# Patient Record
Sex: Female | Born: 1970 | Race: Black or African American | Hispanic: No | Marital: Married | Smoking: Never smoker
Health system: Southern US, Community
[De-identification: ages and names within clinical notes are randomized; demographics above are authoritative.]

## PROBLEM LIST (undated history)

## (undated) DIAGNOSIS — R200 Anesthesia of skin: Secondary | ICD-10-CM

## (undated) DIAGNOSIS — I10 Essential (primary) hypertension: Secondary | ICD-10-CM

## (undated) DIAGNOSIS — K589 Irritable bowel syndrome without diarrhea: Secondary | ICD-10-CM

## (undated) DIAGNOSIS — R52 Pain, unspecified: Secondary | ICD-10-CM

## (undated) DIAGNOSIS — K219 Gastro-esophageal reflux disease without esophagitis: Secondary | ICD-10-CM

## (undated) DIAGNOSIS — G43909 Migraine, unspecified, not intractable, without status migrainosus: Secondary | ICD-10-CM

## (undated) HISTORY — DX: Anesthesia of skin: R20.0

## (undated) HISTORY — DX: Pain, unspecified: R52

## (undated) HISTORY — DX: Irritable bowel syndrome, unspecified: K58.9

## (undated) HISTORY — PX: OTHER SURGICAL HISTORY: SHX169

## (undated) HISTORY — DX: Migraine, unspecified, not intractable, without status migrainosus: G43.909

## (undated) HISTORY — PX: ABDOMINAL HYSTERECTOMY: SUR658

## (undated) HISTORY — PX: ABDOMINAL HYSTERECTOMY: SHX81

## (undated) HISTORY — PX: BREAST BIOPSY: SHX20

---

## 1997-11-02 HISTORY — PX: TUBAL LIGATION: SHX77

## 1997-12-06 ENCOUNTER — Inpatient Hospital Stay (HOSPITAL_COMMUNITY): Admission: AD | Admit: 1997-12-06 | Discharge: 1997-12-06 | Payer: Self-pay | Admitting: Obstetrics and Gynecology

## 1997-12-13 ENCOUNTER — Inpatient Hospital Stay (HOSPITAL_COMMUNITY): Admission: RE | Admit: 1997-12-13 | Discharge: 1997-12-14 | Payer: Self-pay | Admitting: Obstetrics and Gynecology

## 1997-12-27 ENCOUNTER — Inpatient Hospital Stay (HOSPITAL_COMMUNITY): Admission: AD | Admit: 1997-12-27 | Discharge: 1997-12-27 | Payer: Self-pay | Admitting: Obstetrics & Gynecology

## 1998-01-09 ENCOUNTER — Inpatient Hospital Stay (HOSPITAL_COMMUNITY): Admission: AD | Admit: 1998-01-09 | Discharge: 1998-01-12 | Payer: Self-pay | Admitting: Obstetrics and Gynecology

## 1998-06-20 ENCOUNTER — Ambulatory Visit (HOSPITAL_COMMUNITY): Admission: RE | Admit: 1998-06-20 | Discharge: 1998-06-20 | Payer: Self-pay | Admitting: Obstetrics and Gynecology

## 1999-02-18 ENCOUNTER — Ambulatory Visit (HOSPITAL_COMMUNITY): Admission: RE | Admit: 1999-02-18 | Discharge: 1999-02-18 | Payer: Self-pay | Admitting: Family Medicine

## 1999-02-18 ENCOUNTER — Encounter: Payer: Self-pay | Admitting: Family Medicine

## 2000-03-07 ENCOUNTER — Emergency Department (HOSPITAL_COMMUNITY): Admission: EM | Admit: 2000-03-07 | Discharge: 2000-03-07 | Payer: Self-pay | Admitting: Emergency Medicine

## 2000-09-08 ENCOUNTER — Encounter: Admission: RE | Admit: 2000-09-08 | Discharge: 2000-09-08 | Payer: Self-pay | Admitting: Family Medicine

## 2000-09-08 ENCOUNTER — Encounter: Payer: Self-pay | Admitting: Family Medicine

## 2001-01-31 ENCOUNTER — Encounter: Admission: RE | Admit: 2001-01-31 | Discharge: 2001-01-31 | Payer: Self-pay | Admitting: Family Medicine

## 2001-01-31 ENCOUNTER — Encounter: Payer: Self-pay | Admitting: Family Medicine

## 2001-05-06 ENCOUNTER — Other Ambulatory Visit: Admission: RE | Admit: 2001-05-06 | Discharge: 2001-05-06 | Payer: Self-pay | Admitting: *Deleted

## 2001-11-26 ENCOUNTER — Emergency Department (HOSPITAL_COMMUNITY): Admission: EM | Admit: 2001-11-26 | Discharge: 2001-11-26 | Payer: Self-pay | Admitting: *Deleted

## 2002-04-04 ENCOUNTER — Emergency Department (HOSPITAL_COMMUNITY): Admission: EM | Admit: 2002-04-04 | Discharge: 2002-04-05 | Payer: Self-pay | Admitting: Emergency Medicine

## 2002-06-21 ENCOUNTER — Other Ambulatory Visit: Admission: RE | Admit: 2002-06-21 | Discharge: 2002-06-21 | Payer: Self-pay | Admitting: Family Medicine

## 2002-08-07 ENCOUNTER — Emergency Department (HOSPITAL_COMMUNITY): Admission: EM | Admit: 2002-08-07 | Discharge: 2002-08-07 | Payer: Self-pay

## 2002-12-06 ENCOUNTER — Other Ambulatory Visit: Admission: RE | Admit: 2002-12-06 | Discharge: 2002-12-06 | Payer: Self-pay | Admitting: Obstetrics and Gynecology

## 2002-12-18 ENCOUNTER — Ambulatory Visit (HOSPITAL_COMMUNITY): Admission: RE | Admit: 2002-12-18 | Discharge: 2002-12-18 | Payer: Self-pay | Admitting: Obstetrics and Gynecology

## 2002-12-18 ENCOUNTER — Encounter: Payer: Self-pay | Admitting: Obstetrics and Gynecology

## 2003-07-11 ENCOUNTER — Encounter: Payer: Self-pay | Admitting: Family Medicine

## 2003-07-11 ENCOUNTER — Encounter: Admission: RE | Admit: 2003-07-11 | Discharge: 2003-07-11 | Payer: Self-pay | Admitting: Family Medicine

## 2004-01-10 ENCOUNTER — Emergency Department (HOSPITAL_COMMUNITY): Admission: EM | Admit: 2004-01-10 | Discharge: 2004-01-10 | Payer: Self-pay | Admitting: Emergency Medicine

## 2004-01-22 ENCOUNTER — Other Ambulatory Visit: Admission: RE | Admit: 2004-01-22 | Discharge: 2004-01-22 | Payer: Self-pay | Admitting: Obstetrics and Gynecology

## 2004-08-12 ENCOUNTER — Emergency Department (HOSPITAL_COMMUNITY): Admission: EM | Admit: 2004-08-12 | Discharge: 2004-08-12 | Payer: Self-pay | Admitting: Family Medicine

## 2004-08-12 ENCOUNTER — Ambulatory Visit (HOSPITAL_COMMUNITY): Admission: RE | Admit: 2004-08-12 | Discharge: 2004-08-12 | Payer: Self-pay | Admitting: Family Medicine

## 2004-10-03 ENCOUNTER — Emergency Department (HOSPITAL_COMMUNITY): Admission: EM | Admit: 2004-10-03 | Discharge: 2004-10-03 | Payer: Self-pay | Admitting: Emergency Medicine

## 2004-11-02 ENCOUNTER — Emergency Department (HOSPITAL_COMMUNITY): Admission: EM | Admit: 2004-11-02 | Discharge: 2004-11-03 | Payer: Self-pay | Admitting: Emergency Medicine

## 2005-06-27 ENCOUNTER — Emergency Department (HOSPITAL_COMMUNITY): Admission: EM | Admit: 2005-06-27 | Discharge: 2005-06-28 | Payer: Self-pay | Admitting: Family Medicine

## 2005-09-08 ENCOUNTER — Emergency Department (HOSPITAL_COMMUNITY): Admission: EM | Admit: 2005-09-08 | Discharge: 2005-09-08 | Payer: Self-pay | Admitting: Emergency Medicine

## 2005-10-09 ENCOUNTER — Emergency Department (HOSPITAL_COMMUNITY): Admission: EM | Admit: 2005-10-09 | Discharge: 2005-10-09 | Payer: Self-pay | Admitting: Family Medicine

## 2005-11-02 HISTORY — PX: KNEE SURGERY: SHX244

## 2006-04-02 ENCOUNTER — Ambulatory Visit (HOSPITAL_COMMUNITY): Admission: RE | Admit: 2006-04-02 | Discharge: 2006-04-02 | Payer: Self-pay | Admitting: Obstetrics & Gynecology

## 2006-07-09 ENCOUNTER — Emergency Department (HOSPITAL_COMMUNITY): Admission: EM | Admit: 2006-07-09 | Discharge: 2006-07-10 | Payer: Self-pay | Admitting: Emergency Medicine

## 2007-11-24 ENCOUNTER — Emergency Department (HOSPITAL_COMMUNITY): Admission: EM | Admit: 2007-11-24 | Discharge: 2007-11-24 | Payer: Self-pay | Admitting: Emergency Medicine

## 2009-02-26 ENCOUNTER — Encounter: Admission: RE | Admit: 2009-02-26 | Discharge: 2009-02-26 | Payer: Self-pay | Admitting: Family Medicine

## 2009-11-04 ENCOUNTER — Emergency Department (HOSPITAL_COMMUNITY): Admission: EM | Admit: 2009-11-04 | Discharge: 2009-11-05 | Payer: Self-pay | Admitting: Emergency Medicine

## 2010-01-30 ENCOUNTER — Encounter: Admission: RE | Admit: 2010-01-30 | Discharge: 2010-01-30 | Payer: Self-pay | Admitting: Obstetrics & Gynecology

## 2010-08-20 ENCOUNTER — Encounter: Admission: RE | Admit: 2010-08-20 | Discharge: 2010-08-20 | Payer: Self-pay | Admitting: Obstetrics & Gynecology

## 2010-11-23 ENCOUNTER — Encounter: Payer: Self-pay | Admitting: Family Medicine

## 2011-01-17 LAB — CBC
Hemoglobin: 9.7 g/dL — ABNORMAL LOW (ref 12.0–15.0)
MCHC: 32.4 g/dL (ref 30.0–36.0)
MCV: 78 fL (ref 78.0–100.0)
RBC: 3.84 MIL/uL — ABNORMAL LOW (ref 3.87–5.11)

## 2011-01-17 LAB — DIFFERENTIAL
Basophils Relative: 2 % — ABNORMAL HIGH (ref 0–1)
Eosinophils Absolute: 0.1 10*3/uL (ref 0.0–0.7)
Monocytes Absolute: 0.2 10*3/uL (ref 0.1–1.0)
Monocytes Relative: 4 % (ref 3–12)

## 2011-01-17 LAB — URINALYSIS, ROUTINE W REFLEX MICROSCOPIC
Nitrite: NEGATIVE
Specific Gravity, Urine: 1.018 (ref 1.005–1.030)
Urobilinogen, UA: 1 mg/dL (ref 0.0–1.0)

## 2011-01-17 LAB — GC/CHLAMYDIA PROBE AMP, GENITAL
Chlamydia, DNA Probe: NEGATIVE
GC Probe Amp, Genital: NEGATIVE

## 2011-01-17 LAB — POCT I-STAT, CHEM 8
Calcium, Ion: 1.11 mmol/L — ABNORMAL LOW (ref 1.12–1.32)
Chloride: 107 mEq/L (ref 96–112)
Creatinine, Ser: 0.8 mg/dL (ref 0.4–1.2)
Glucose, Bld: 90 mg/dL (ref 70–99)
HCT: 32 % — ABNORMAL LOW (ref 36.0–46.0)

## 2011-07-31 ENCOUNTER — Other Ambulatory Visit (HOSPITAL_COMMUNITY): Payer: Self-pay | Admitting: Gastroenterology

## 2011-07-31 DIAGNOSIS — K219 Gastro-esophageal reflux disease without esophagitis: Secondary | ICD-10-CM

## 2011-08-20 ENCOUNTER — Encounter (HOSPITAL_COMMUNITY)
Admission: RE | Admit: 2011-08-20 | Discharge: 2011-08-20 | Disposition: A | Discharge status: Home / Self Care | Payer: 59 | Source: Ambulatory Visit | Attending: Gastroenterology | Admitting: Gastroenterology

## 2011-08-20 ENCOUNTER — Encounter (HOSPITAL_COMMUNITY): Payer: Self-pay

## 2011-08-20 DIAGNOSIS — K219 Gastro-esophageal reflux disease without esophagitis: Secondary | ICD-10-CM

## 2011-08-20 DIAGNOSIS — R109 Unspecified abdominal pain: Secondary | ICD-10-CM | POA: Insufficient documentation

## 2011-08-20 HISTORY — DX: Gastro-esophageal reflux disease without esophagitis: K21.9

## 2011-08-20 MED ORDER — TECHNETIUM TC 99M SULFUR COLLOID: 2.0000 | Freq: Once | INTRAVENOUS | Status: AC | PRN

## 2011-10-15 ENCOUNTER — Encounter (HOSPITAL_COMMUNITY): Payer: Self-pay | Admitting: *Deleted

## 2011-10-15 ENCOUNTER — Emergency Department (HOSPITAL_COMMUNITY)
Admission: EM | Admit: 2011-10-15 | Discharge: 2011-10-15 | Disposition: A | Discharge status: Home / Self Care | Payer: 59 | Source: Home / Self Care | Attending: Family Medicine | Admitting: Family Medicine

## 2011-10-15 DIAGNOSIS — J069 Acute upper respiratory infection, unspecified: Secondary | ICD-10-CM

## 2011-10-15 HISTORY — DX: Essential (primary) hypertension: I10

## 2011-10-15 MED ORDER — IPRATROPIUM BROMIDE 0.06 % NA SOLN: 2.0000 | Freq: Four times a day (QID) | NASAL | Status: DC

## 2011-10-15 MED ORDER — HYDROCOD POLST-CHLORPHEN POLST 10-8 MG/5ML PO LQCR: 5.0000 mL | Freq: Two times a day (BID) | ORAL | Status: DC

## 2011-10-15 NOTE — ED Provider Notes (Signed)
History     CSN: [id number] Arrival date & time: 10/15/2011  6:06 PM   First MD Initiated Contact with Patient 10/15/11 1728      Chief Complaint  Patient presents with  . Cough    (Consider location/radiation/quality/duration/timing/severity/associated sxs/prior treatment) Patient is a 40 y.o. female presenting with cough. The history is provided by the patient.  Cough This is a new problem. The current episode started more than 1 week ago. The problem has not changed since onset.The cough is productive of sputum. Associated symptoms include ear congestion, ear pain, rhinorrhea and sore throat. She has tried cough syrup for the symptoms. The treatment provided no relief. She is not a smoker.    Past Medical History  Diagnosis Date  . Esophageal reflux   . Hypertension     Past Surgical History  Procedure Date  . Btl   . Knee surgery     Family History  Problem Relation Age of Onset  . Hypertension Mother   . Hypertension Father     History  Substance Use Topics  . Smoking status: Never Smoker   . Smokeless tobacco: Not on file  . Alcohol Use: Yes     SOCIAL    OB History    Grav Para Term Preterm Abortions TAB SAB Ect Mult Living                  Review of Systems  Constitutional: Positive for fever.  HENT: Positive for ear pain, congestion, sore throat, rhinorrhea and postnasal drip.   Respiratory: Positive for cough.   Cardiovascular: Negative.   Gastrointestinal: Negative.     Allergies  Review of patient's allergies indicates no known allergies.  Home Medications   Current Outpatient Rx  Name Route Sig Dispense Refill  . HYDROCHLOROTHIAZIDE 25 MG PO TABS Oral Take 25 mg by mouth daily.      Marland Kitchen OMEPRAZOLE 20 MG PO CPDR Oral Take 20 mg by mouth daily.      Marland Kitchen HYDROCOD POLST-CHLORPHEN POLST 10-8 MG/5ML PO LQCR Oral Take 5 mLs by mouth every 12 (twelve) hours. For cough 115 mL 0  . IPRATROPIUM BROMIDE 0.06 % NA SOLN Nasal Place 2 sprays into the  nose 4 (four) times daily. 15 mL 0    BP 158/101  Pulse 68  Temp(Src) 98.3 F (36.8 C) (Oral)  Resp 16  SpO2 100%  LMP 10/05/2011  Physical Exam  Nursing note and vitals reviewed. Constitutional: She appears well-developed and well-nourished.  HENT:  Head: Normocephalic.  Right Ear: External ear normal.  Left Ear: External ear normal.  Mouth/Throat: Oropharynx is clear and moist.  Eyes: Conjunctivae and EOM are normal. Pupils are equal, round, and reactive to light.  Neck: Normal range of motion. Neck supple.  Cardiovascular: Normal rate, normal heart sounds and intact distal pulses.   Pulmonary/Chest: Effort normal and breath sounds normal.  Abdominal: Soft. Bowel sounds are normal.  Lymphadenopathy:    She has no cervical adenopathy.  Skin: Skin is warm and dry.    ED Course  Procedures (including critical care time)  Labs Reviewed - No data to display No results found.   1. URI (upper respiratory infection)       MDM          Barkley Bruns, MD 10/15/11 1843

## 2011-10-15 NOTE — ED Notes (Signed)
pT  HAS  SYMPTOMS  OF  COUGH / CONGESTED  EARS  POPPING  WEAKNESS  AND  ACHING ALL  OVER   X  1  WEEK    PT  AWAKE  ALERT  TALKING IN  COMPLETE  SENTANCES

## 2012-05-04 ENCOUNTER — Other Ambulatory Visit: Payer: Self-pay | Admitting: Gastroenterology

## 2012-05-04 DIAGNOSIS — R109 Unspecified abdominal pain: Secondary | ICD-10-CM

## 2012-05-09 ENCOUNTER — Ambulatory Visit
Admission: RE | Admit: 2012-05-09 | Discharge: 2012-05-09 | Disposition: A | Discharge status: Home / Self Care | Payer: 59 | Source: Ambulatory Visit | Attending: Gastroenterology | Admitting: Gastroenterology

## 2012-05-09 DIAGNOSIS — R109 Unspecified abdominal pain: Secondary | ICD-10-CM

## 2012-05-26 ENCOUNTER — Other Ambulatory Visit: Payer: Self-pay | Admitting: Obstetrics & Gynecology

## 2012-05-26 DIAGNOSIS — N6310 Unspecified lump in the right breast, unspecified quadrant: Secondary | ICD-10-CM

## 2012-05-27 ENCOUNTER — Ambulatory Visit
Admission: RE | Admit: 2012-05-27 | Discharge: 2012-05-27 | Disposition: A | Discharge status: Home / Self Care | Payer: 59 | Source: Ambulatory Visit | Attending: Obstetrics & Gynecology | Admitting: Obstetrics & Gynecology

## 2012-05-27 ENCOUNTER — Other Ambulatory Visit: Payer: Self-pay | Admitting: Obstetrics & Gynecology

## 2012-05-27 DIAGNOSIS — N6310 Unspecified lump in the right breast, unspecified quadrant: Secondary | ICD-10-CM

## 2012-05-27 DIAGNOSIS — N6320 Unspecified lump in the left breast, unspecified quadrant: Secondary | ICD-10-CM

## 2012-06-07 ENCOUNTER — Other Ambulatory Visit: Payer: Self-pay | Admitting: Obstetrics & Gynecology

## 2012-06-07 ENCOUNTER — Ambulatory Visit
Admission: RE | Admit: 2012-06-07 | Discharge: 2012-06-07 | Disposition: A | Discharge status: Home / Self Care | Payer: 59 | Source: Ambulatory Visit | Attending: Obstetrics & Gynecology | Admitting: Obstetrics & Gynecology

## 2012-06-07 DIAGNOSIS — N6320 Unspecified lump in the left breast, unspecified quadrant: Secondary | ICD-10-CM

## 2012-06-07 HISTORY — PX: BREAST BIOPSY: SHX20

## 2012-06-08 ENCOUNTER — Inpatient Hospital Stay: Admission: RE | Admit: 2012-06-08 | Payer: 59 | Source: Ambulatory Visit

## 2012-06-21 ENCOUNTER — Other Ambulatory Visit (HOSPITAL_COMMUNITY): Payer: Self-pay | Admitting: Otolaryngology

## 2012-06-21 DIAGNOSIS — R0989 Other specified symptoms and signs involving the circulatory and respiratory systems: Secondary | ICD-10-CM

## 2012-06-29 ENCOUNTER — Other Ambulatory Visit (HOSPITAL_COMMUNITY): Payer: 59

## 2012-06-29 ENCOUNTER — Ambulatory Visit (HOSPITAL_COMMUNITY): Admission: RE | Admit: 2012-06-29 | Payer: 59 | Source: Ambulatory Visit

## 2012-07-01 ENCOUNTER — Other Ambulatory Visit (HOSPITAL_COMMUNITY): Payer: 59

## 2012-07-06 ENCOUNTER — Ambulatory Visit (HOSPITAL_COMMUNITY)
Admission: RE | Admit: 2012-07-06 | Discharge: 2012-07-06 | Disposition: A | Discharge status: Home / Self Care | Payer: 59 | Source: Ambulatory Visit | Attending: Family Medicine | Admitting: Family Medicine

## 2012-07-06 ENCOUNTER — Ambulatory Visit (HOSPITAL_COMMUNITY)
Admission: RE | Admit: 2012-07-06 | Discharge: 2012-07-06 | Disposition: A | Discharge status: Home / Self Care | Payer: 59 | Source: Ambulatory Visit | Attending: Otolaryngology | Admitting: Otolaryngology

## 2012-07-06 DIAGNOSIS — R0989 Other specified symptoms and signs involving the circulatory and respiratory systems: Secondary | ICD-10-CM

## 2012-07-06 DIAGNOSIS — K219 Gastro-esophageal reflux disease without esophagitis: Secondary | ICD-10-CM | POA: Insufficient documentation

## 2012-07-06 DIAGNOSIS — R1319 Other dysphagia: Secondary | ICD-10-CM | POA: Insufficient documentation

## 2012-07-06 DIAGNOSIS — R1310 Dysphagia, unspecified: Secondary | ICD-10-CM | POA: Insufficient documentation

## 2012-07-06 DIAGNOSIS — I10 Essential (primary) hypertension: Secondary | ICD-10-CM | POA: Insufficient documentation

## 2012-07-06 DIAGNOSIS — R6889 Other general symptoms and signs: Secondary | ICD-10-CM | POA: Insufficient documentation

## 2012-07-06 NOTE — Procedures (Signed)
Objective Swallowing Evaluation: Modified Barium Swallowing Study  Patient Details  Name: Doral Digangi MRN: [id number] Date of Birth: [birth year]  Today's Date: 07/06/2012 Time: 1145-1200 SLP Time Calculation (min): 15 min  Past Medical History:  Past Medical History  Diagnosis Date  . Esophageal reflux   . Hypertension    Past Surgical History:  Past Surgical History  Procedure Date  . Btl   . Knee surgery    HPI:  41 year old female seen for outpatient MBS secondary to c/o globus which has been occurring for 2 years. She reports a h/o HTN as well as GERD which was diagnosed 2 years ago. Currently takes omeprazole.      Assessment / Plan / Recommendation Clinical Impression  Dysphagia Diagnosis: Suspected primary esophageal dysphagia Clinical impression: Suspect a primary esophageal dysphagia based on normal oropharyngeal swallowing function with continued c/o globus despite full pharyngeal clearance. Pill was noted to delay briefly (1-2) seconds in lower cervical esophagus before clearing. Note that patient does have a barium swallow scheduled for after today's exam which would be recommended to further assess esophageal function. No further acute SLP needs indicated at this time. Provided brief education regarding general esophageal precautions including diet texture recommendations and compensatory strategies which may eleviate symptoms.     Treatment Recommendation  No treatment recommended at this time    Diet Recommendation Regular;Thin liquid (avoid very dry, crumbly, or tough solids)   Liquid Administration via: Cup;Straw Medication Administration: Whole meds with liquid Compensations: Slow rate;Small sips/bites;Follow solids with liquid (room temperature or warm liquids may be helpful) Postural Changes and/or Swallow Maneuvers: Seated upright 90 degrees;Upright 30-60 min after meal    Other  Recommendations Recommended Consults: Consider GI evaluation Oral Care  Recommendations: Oral care BID   Follow Up Recommendations  None            General HPI: 41 year old female seen for outpatient MBS secondary to c/o globus which has been occurring for 2 years. She reports a h/o HTN as well as GERD which was diagnosed 2 years ago. Currently takes omeprazole.  Type of Study: Modified Barium Swallowing Study Reason for Referral: Objectively evaluate swallowing function Diet Prior to this Study: Regular;Thin liquids Temperature Spikes Noted: No Respiratory Status: Room air History of Recent Intubation: No Behavior/Cognition: Alert;Cooperative;Pleasant mood Oral Cavity - Dentition: Adequate natural dentition Oral Motor / Sensory Function: Within functional limits Self-Feeding Abilities: Able to feed self Patient Positioning: Upright in chair Baseline Vocal Quality: Clear Volitional Cough: Strong Volitional Swallow: Able to elicit Anatomy: Within functional limits Pharyngeal Secretions: Not observed secondary MBS    Reason for Referral Objectively evaluate swallowing function      Pharyngeal Phase Pharyngeal Phase: Within functional limits   Cervical Esophageal Phase    GO   Ferdinand Lango MA, CCC-SLP [phone]  Cervical Esophageal Phase: Impaired    Bettye Sitton Meryl 07/06/2012, 12:54 PM

## 2012-07-08 ENCOUNTER — Encounter: Payer: Self-pay | Admitting: Gastroenterology

## 2012-08-03 ENCOUNTER — Ambulatory Visit: Payer: 59 | Admitting: Gastroenterology

## 2012-11-02 HISTORY — PX: TUBAL LIGATION: SHX77

## 2012-12-17 ENCOUNTER — Emergency Department (HOSPITAL_COMMUNITY): Payer: 59

## 2012-12-17 ENCOUNTER — Emergency Department (HOSPITAL_COMMUNITY)
Admission: EM | Admit: 2012-12-17 | Discharge: 2012-12-17 | Disposition: A | Discharge status: Home / Self Care | Payer: 59 | Attending: Emergency Medicine | Admitting: Emergency Medicine

## 2012-12-17 ENCOUNTER — Encounter (HOSPITAL_COMMUNITY): Payer: Self-pay

## 2012-12-17 DIAGNOSIS — R0602 Shortness of breath: Secondary | ICD-10-CM | POA: Insufficient documentation

## 2012-12-17 DIAGNOSIS — K219 Gastro-esophageal reflux disease without esophagitis: Secondary | ICD-10-CM | POA: Insufficient documentation

## 2012-12-17 DIAGNOSIS — R51 Headache: Secondary | ICD-10-CM | POA: Insufficient documentation

## 2012-12-17 DIAGNOSIS — Z79899 Other long term (current) drug therapy: Secondary | ICD-10-CM | POA: Insufficient documentation

## 2012-12-17 DIAGNOSIS — Z9889 Other specified postprocedural states: Secondary | ICD-10-CM | POA: Insufficient documentation

## 2012-12-17 DIAGNOSIS — R42 Dizziness and giddiness: Secondary | ICD-10-CM | POA: Insufficient documentation

## 2012-12-17 LAB — COMPREHENSIVE METABOLIC PANEL
ALT: 12 U/L (ref 0–35)
AST: 26 U/L (ref 0–37)
Albumin: 3.5 g/dL (ref 3.5–5.2)
Chloride: 102 mEq/L (ref 96–112)
Creatinine, Ser: 0.82 mg/dL (ref 0.50–1.10)
Potassium: 3.6 mEq/L (ref 3.5–5.1)
Sodium: 140 mEq/L (ref 135–145)
Total Bilirubin: 0.1 mg/dL — ABNORMAL LOW (ref 0.3–1.2)

## 2012-12-17 LAB — CBC
MCV: 82.8 fL (ref 78.0–100.0)
Platelets: 327 10*3/uL (ref 150–400)
RBC: 3.95 MIL/uL (ref 3.87–5.11)
RDW: 14.9 % (ref 11.5–15.5)
WBC: 5.7 10*3/uL (ref 4.0–10.5)

## 2012-12-17 MED ORDER — MECLIZINE HCL 50 MG PO TABS: 50.0000 mg | ORAL_TABLET | Freq: Two times a day (BID) | ORAL | Status: DC

## 2012-12-17 NOTE — ED Provider Notes (Signed)
This is a 42 year old female, who presents emergency department with chief complaint of hypertension and dizziness. Patient was originally seen by Drue Novel, Cordelia Poche. Patient recently had a tubal reversal about a week ago. She has been complaining of dizziness x3 days. Patient states that she still feels a little dizzy with ambulation. She believes that the dizziness could be cleared to pain medicines that she has been taking.  Review of systems: Still slightly dizzy  PE: Gen: A&O x4, Steady gait, no ataxia HEENT: PERRL, EOM intact CHEST: RRR, no m/r/g LUNGS: CTAB, no w/r/r ABD: BS x 4, ND/NT EXT: No edema, strong peripheral pulses NEURO: Sensation and strength intact bilaterally  Assessment: Dizziness, likely secondary to home medications versus hypertension. Labs did not show any signs of end organ damage.  Results for orders placed during the hospital encounter of 12/17/12  CBC      Result Value Range   WBC 5.7  4.0 - 10.5 K/uL   RBC 3.95  3.87 - 5.11 MIL/uL   Hemoglobin 11.1 (*) 12.0 - 15.0 g/dL   HCT 96.0 (*) 45.4 - 09.8 %   MCV 82.8  78.0 - 100.0 fL   MCH 28.1  26.0 - 34.0 pg   MCHC 33.9  30.0 - 36.0 g/dL   RDW 11.9  14.7 - 82.9 %   Platelets 327  150 - 400 K/uL  COMPREHENSIVE METABOLIC PANEL      Result Value Range   Sodium 140  135 - 145 mEq/L   Potassium 3.6  3.5 - 5.1 mEq/L   Chloride 102  96 - 112 mEq/L   CO2 27  19 - 32 mEq/L   Glucose, Bld 101 (*) 70 - 99 mg/dL   BUN 11  6 - 23 mg/dL   Creatinine, Ser 5.62  0.50 - 1.10 mg/dL   Calcium 9.7  8.4 - 13.0 mg/dL   Total Protein 7.2  6.0 - 8.3 g/dL   Albumin 3.5  3.5 - 5.2 g/dL   AST 26  0 - 37 U/L   ALT 12  0 - 35 U/L   Alkaline Phosphatase 61  39 - 117 U/L   Total Bilirubin 0.1 (*) 0.3 - 1.2 mg/dL   GFR calc non Af Amer 89 (*) >90 mL/min   GFR calc Af Amer >90  >90 mL/min   Ct Head Wo Contrast  12/17/2012  *RADIOLOGY REPORT*  Clinical Data: Hypertension; lightheadedness.  CT HEAD WITHOUT CONTRAST  Technique:  Contiguous  axial images were obtained from the base of the skull through the vertex without contrast.  Comparison: CT of the head performed 06/27/2005  Findings: There is no evidence of acute infarction, mass lesion, or intra- or extra-axial hemorrhage on CT.  The posterior fossa, including the cerebellum, brainstem and fourth ventricle, is within normal limits.  The third and lateral ventricles, and basal ganglia are unremarkable in appearance.  The cerebral hemispheres are symmetric in appearance, with normal gray- white differentiation.  No mass effect or midline shift is seen.  There is no evidence of fracture; visualized osseous structures are unremarkable in appearance.  The visualized portions of the orbits are within normal limits.  The paranasal sinuses and mastoid air cells are well-aerated.  No significant soft tissue abnormalities are seen.  IMPRESSION: Unremarkable noncontrast CT of the head.   Original Report Authenticated By: Tonia Ghent, M.D.       Plan: Patient's labs and CT are reassuring. Will discharge the patient to home with primary care followup. Will  give the patient meclizine. Patient is stable and ready for discharge.  Filed Vitals:   12/17/12 0507  BP: 124/82  Pulse: 64  Temp:   Resp:       Roxy Horseman, PA-C 12/17/12 [phone]

## 2012-12-17 NOTE — ED Notes (Signed)
Pt to ct via stretcher

## 2012-12-17 NOTE — ED Provider Notes (Signed)
History     CSN: [id number]  Arrival date & time 12/17/12  0402   First MD Initiated Contact with Patient 12/17/12 0431      Chief Complaint  Patient presents with  . Hypertension    (Consider location/radiation/quality/duration/timing/severity/associated sxs/prior treatment) HPI Comments: Stellar Gensel is a 42 y.o. female w hx of HTN presents to the ER c/o worsening HTN over the last 3 days s/p tubal reversal surgery. She reports her BP getting as high as 200/110. Symptoms are moderate. No known exacerbating or alleviating factors. Associated s/s include HA, blurred vision, dizziness & SOB. She reports that "my head just feels foggy and different, i cant really explain it." Pt denies any CP, Syncope, palpitations, cough, hemoptysis, fevers, NS, chills, leg swelling, hemoptysis, change in urination or BM. No other complaints at this time.   Patient is a 42 y.o. female presenting with hypertension. The history is provided by the patient.  Hypertension    Past Medical History  Diagnosis Date  . Esophageal reflux   . Hypertension     Past Surgical History  Procedure Laterality Date  . Btl    . Knee surgery      Family History  Problem Relation Age of Onset  . Hypertension Mother   . Hypertension Father     History  Substance Use Topics  . Smoking status: Never Smoker   . Smokeless tobacco: Not on file  . Alcohol Use: Yes     Comment: SOCIAL    OB History   Grav Para Term Preterm Abortions TAB SAB Ect Mult Living                  Review of Systems  All other systems reviewed and are negative.    Allergies  Review of patient's allergies indicates no known allergies.  Home Medications   Current Outpatient Rx  Name  Route  Sig  Dispense  Refill  . ALPRAZolam (XANAX) 0.25 MG tablet   Oral   Take 0.25 mg by mouth 3 (three) times daily as needed for anxiety.         . hydrochlorothiazide (HYDRODIURIL) 25 MG tablet   Oral   Take 25 mg by mouth daily.            Marland Kitchen HYDROmorphone (DILAUDID) 2 MG tablet   Oral   Take 2 mg by mouth every 4 (four) hours as needed for pain.         Marland Kitchen ibuprofen (ADVIL,MOTRIN) 800 MG tablet   Oral   Take 800 mg by mouth every 8 (eight) hours as needed for pain.         Marland Kitchen omeprazole-sodium bicarbonate (ZEGERID) 40-1100 MG per capsule   Oral   Take 1 capsule by mouth daily before breakfast.         . promethazine (PHENERGAN) 25 MG tablet   Oral   Take 25 mg by mouth every 6 (six) hours as needed for nausea.         Marland Kitchen azithromycin (ZITHROMAX) 250 MG tablet   Oral   Take 250 mg by mouth daily.           BP 124/82  Pulse 64  Temp(Src) 98.3 F (36.8 C) (Oral)  Resp 20  SpO2 99%  LMP 12/10/2012  Physical Exam  Nursing note and vitals reviewed. Constitutional: She is oriented to person, place, and time. She appears well-developed and well-nourished. No distress.  Patient not in visible distress  HENT:  Head:  Normocephalic and atraumatic.  Eyes: Conjunctivae and EOM are normal. Pupils are equal, round, and reactive to light.  Neck: Normal range of motion. Neck supple. Normal carotid pulses, no hepatojugular reflux and no JVD present. Carotid bruit is not present.  No carotid bruits  Cardiovascular:  RRR, no aberrancy on auscultation, intact distal pulses no pitting edema bilaterally.  Pulmonary/Chest: Effort normal and breath sounds normal. No respiratory distress.  Lungs clear to auscultation bilaterally  Abdominal: Soft. She exhibits no distension. There is no tenderness.  Nonpulsatile aorta  Musculoskeletal: Normal range of motion. She exhibits no tenderness.  Neurological: She is alert and oriented to person, place, and time. She displays a negative Romberg sign.  Cranial nerves III through XII intact, normal coordination, negative Romberg, strength 5/5 bilaterally. No ataxia  Skin: Skin is warm and dry. No pallor.  Psychiatric: She has a normal mood and affect. Her behavior is  normal.    ED Course  Procedures (including critical care time)  Labs Reviewed  CBC - Abnormal; Notable for the following:    Hemoglobin 11.1 (*)    HCT 32.7 (*)    All other components within normal limits  COMPREHENSIVE METABOLIC PANEL - Abnormal; Notable for the following:    Glucose, Bld 101 (*)    Total Bilirubin 0.1 (*)    GFR calc non Af Amer 89 (*)    All other components within normal limits   No results found.   No diagnosis found.    MDM  HTN  Pt to ER c/o HTN, dizziness and HA. No focal neuro deficits on exam. CT head pending. Labs reviewed and no signs of end organ damage. Anticipate dc home if CT normal. Recommend observing ambulation prior to dc. Low concern for hypertensive crisis. Care resumed by [facility], PA-C. Case also discussed with attending who agrees w wk up thus far.         Jaci Carrel, PA-C 12/17/12 0600

## 2012-12-17 NOTE — ED Provider Notes (Signed)
Medical screening examination/treatment/procedure(s) were performed by non-physician practitioner and as supervising physician I was immediately available for consultation/collaboration.   Jeriann Sayres L Inga Noller, MD 12/17/12 0658 

## 2012-12-17 NOTE — ED Notes (Signed)
Pt states feeling much better

## 2012-12-17 NOTE — ED Provider Notes (Signed)
Medical screening examination/treatment/procedure(s) were performed by non-physician practitioner and as supervising physician I was immediately available for consultation/collaboration.   Geroldine Esquivias L Vestal Crandall, MD 12/17/12 0657 

## 2012-12-17 NOTE — ED Notes (Signed)
Walked with pt in hallway.  Pt states feeling better now.  Only pain she has is in her incision site.

## 2012-12-17 NOTE — ED Notes (Signed)
Pt to ed from home with c/o hypertension x 3 days.  States has hx of htn but has been running higher since her surgery (tubal reversal) on the 6th of Feb. C/o SOB, dizziness,  X 3days

## 2012-12-17 NOTE — ED Notes (Signed)
Pt dc to home. Pt states understanding to dc instructions. Pt ambulatory to exit without difficulty. 

## 2013-02-27 ENCOUNTER — Other Ambulatory Visit (HOSPITAL_COMMUNITY): Payer: Self-pay | Admitting: Specialist

## 2013-02-27 DIAGNOSIS — N736 Female pelvic peritoneal adhesions (postinfective): Secondary | ICD-10-CM

## 2013-03-03 ENCOUNTER — Ambulatory Visit (HOSPITAL_COMMUNITY)
Admission: RE | Admit: 2013-03-03 | Discharge: 2013-03-03 | Disposition: A | Discharge status: Home / Self Care | Payer: 59 | Source: Ambulatory Visit | Attending: Specialist | Admitting: Specialist

## 2013-03-03 DIAGNOSIS — N736 Female pelvic peritoneal adhesions (postinfective): Secondary | ICD-10-CM

## 2013-03-03 DIAGNOSIS — N949 Unspecified condition associated with female genital organs and menstrual cycle: Secondary | ICD-10-CM | POA: Insufficient documentation

## 2013-03-03 DIAGNOSIS — Z310 Encounter for reversal of previous sterilization: Secondary | ICD-10-CM | POA: Insufficient documentation

## 2013-03-03 MED ORDER — IOHEXOL 300 MG/ML  SOLN
20.0000 mL | Freq: Once | INTRAMUSCULAR | Status: AC | PRN
  Administered 2013-03-03: 20 mL

## 2013-03-08 ENCOUNTER — Encounter (HOSPITAL_COMMUNITY): Payer: Self-pay | Admitting: Emergency Medicine

## 2013-03-08 ENCOUNTER — Emergency Department (HOSPITAL_COMMUNITY)
Admission: EM | Admit: 2013-03-08 | Discharge: 2013-03-08 | Disposition: A | Discharge status: Home / Self Care | Payer: 59 | Attending: Emergency Medicine | Admitting: Emergency Medicine

## 2013-03-08 DIAGNOSIS — I10 Essential (primary) hypertension: Secondary | ICD-10-CM | POA: Insufficient documentation

## 2013-03-08 DIAGNOSIS — Z3202 Encounter for pregnancy test, result negative: Secondary | ICD-10-CM | POA: Insufficient documentation

## 2013-03-08 DIAGNOSIS — F3289 Other specified depressive episodes: Secondary | ICD-10-CM | POA: Insufficient documentation

## 2013-03-08 DIAGNOSIS — Z79899 Other long term (current) drug therapy: Secondary | ICD-10-CM | POA: Insufficient documentation

## 2013-03-08 DIAGNOSIS — F32A Depression, unspecified: Secondary | ICD-10-CM

## 2013-03-08 DIAGNOSIS — R110 Nausea: Secondary | ICD-10-CM | POA: Insufficient documentation

## 2013-03-08 DIAGNOSIS — Z791 Long term (current) use of non-steroidal anti-inflammatories (NSAID): Secondary | ICD-10-CM | POA: Insufficient documentation

## 2013-03-08 DIAGNOSIS — T394X2A Poisoning by antirheumatics, not elsewhere classified, intentional self-harm, initial encounter: Secondary | ICD-10-CM | POA: Insufficient documentation

## 2013-03-08 DIAGNOSIS — K219 Gastro-esophageal reflux disease without esophagitis: Secondary | ICD-10-CM | POA: Insufficient documentation

## 2013-03-08 DIAGNOSIS — R109 Unspecified abdominal pain: Secondary | ICD-10-CM | POA: Insufficient documentation

## 2013-03-08 DIAGNOSIS — T3991XA Poisoning by unspecified nonopioid analgesic, antipyretic and antirheumatic, accidental (unintentional), initial encounter: Secondary | ICD-10-CM | POA: Insufficient documentation

## 2013-03-08 DIAGNOSIS — F329 Major depressive disorder, single episode, unspecified: Secondary | ICD-10-CM | POA: Insufficient documentation

## 2013-03-08 LAB — RAPID URINE DRUG SCREEN, HOSP PERFORMED
Benzodiazepines: NOT DETECTED
Cocaine: NOT DETECTED
Opiates: NOT DETECTED

## 2013-03-08 LAB — ETHANOL: Alcohol, Ethyl (B): <11 mg/dL (ref 0–11)

## 2013-03-08 LAB — CBC WITH DIFFERENTIAL/PLATELET
Eosinophils Absolute: 0 10*3/uL (ref 0.0–0.7)
Eosinophils Relative: 1 % (ref 0–5)
Hemoglobin: 12.1 g/dL (ref 12.0–15.0)
Lymphs Abs: 1.6 10*3/uL (ref 0.7–4.0)
MCH: 29 pg (ref 26.0–34.0)
MCHC: 34.1 g/dL (ref 30.0–36.0)
MCV: 85.1 fL (ref 78.0–100.0)
Monocytes Relative: 4 % (ref 3–12)
Platelets: 323 10*3/uL (ref 150–400)
RBC: 4.17 MIL/uL (ref 3.87–5.11)

## 2013-03-08 LAB — URINALYSIS, ROUTINE W REFLEX MICROSCOPIC
Glucose, UA: NEGATIVE mg/dL
Hgb urine dipstick: NEGATIVE
Specific Gravity, Urine: 1.016 (ref 1.005–1.030)
Urobilinogen, UA: 0.2 mg/dL (ref 0.0–1.0)
pH: 6 (ref 5.0–8.0)

## 2013-03-08 LAB — COMPREHENSIVE METABOLIC PANEL
BUN: 9 mg/dL (ref 6–23)
Calcium: 10 mg/dL (ref 8.4–10.5)
GFR calc Af Amer: >90 mL/min (ref >90)
Glucose, Bld: 96 mg/dL (ref 70–99)
Total Protein: 7.3 g/dL (ref 6.0–8.3)

## 2013-03-08 LAB — PREGNANCY, URINE: Preg Test, Ur: NEGATIVE

## 2013-03-08 MED ORDER — ONDANSETRON HCL 4 MG/2ML IJ SOLN
4.0000 mg | Freq: Once | INTRAMUSCULAR | Status: AC
  Administered 2013-03-08: 4 mg via INTRAVENOUS
  Filled 2013-03-08: qty 2

## 2013-03-08 MED ORDER — ONDANSETRON HCL 4 MG/2ML IJ SOLN
4.0000 mg | Freq: Once | INTRAMUSCULAR | Status: AC
Start: 2013-03-08 — End: 2013-03-08
  Administered 2013-03-08: 4 mg via INTRAVENOUS
  Filled 2013-03-08: qty 2
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
## 2013-03-08 NOTE — ED Notes (Signed)
Family at bedside. All personal belongings given to mother
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
## 2013-03-08 NOTE — Discharge Instructions (Signed)
Followup on Monday with intensive outpatient as planned  Depression, Adult Depression refers to feeling sad, low, down in the dumps, blue, gloomy, or empty. In general, there are two kinds of depression: 1. Depression that we all experience from time to time because of upsetting life experiences, including the loss of a job or the ending of a relationship (normal sadness or normal grief). This kind of depression is considered normal, is short lived, and resolves within a few days to 2 weeks. (Depression experienced after the loss of a loved one is called bereavement. Bereavement often lasts longer than 2 weeks but normally gets better with time.) 2. Clinical depression, which lasts longer than normal sadness or normal grief or interferes with your ability to function at home, at work, and in school. It also interferes with your personal relationships. It affects almost every aspect of your life. Clinical depression is an illness. Symptoms of depression also can be caused by conditions other than normal sadness and grief or clinical depression. Examples of these conditions are listed as follows:  Physical illness Some physical illnesses, including underactive thyroid gland (hypothyroidism), severe anemia, specific types of cancer, diabetes, uncontrolled seizures, heart and lung problems, strokes, and chronic pain are commonly associated with symptoms of depression.  Side effects of some prescription medicine In some people, certain types of prescription medicine can cause symptoms of depression.  Substance abuse Abuse of alcohol and illicit drugs can cause symptoms of depression. SYMPTOMS Symptoms of normal sadness and normal grief include the following:  Feeling sad or crying for short periods of time.  Not caring about anything (apathy).  Difficulty sleeping or sleeping too much.  No longer able to enjoy the things you used to enjoy.  Desire to be by oneself all the time (social  isolation).  Lack of energy or motivation.  Difficulty concentrating or remembering.  Change in appetite or weight.  Restlessness or agitation. Symptoms of clinical depression include the same symptoms of normal sadness or normal grief and also the following symptoms:  Feeling sad or crying all the time.  Feelings of guilt or worthlessness.  Feelings of hopelessness or helplessness.  Thoughts of suicide or the desire to harm yourself (suicidal ideation).  Loss of touch with reality (psychotic symptoms). Seeing or hearing things that are not real (hallucinations) or having false beliefs about your life or the people around you (delusions and paranoia). DIAGNOSIS  The diagnosis of clinical depression usually is based on the severity and duration of the symptoms. Your caregiver also will ask you questions about your medical history and substance use to find out if physical illness, use of prescription medicine, or substance abuse is causing your depression. Your caregiver also may order blood tests. TREATMENT  Typically, normal sadness and normal grief do not require treatment. However, sometimes antidepressant medicine is prescribed for bereavement to ease the depressive symptoms until they resolve. The treatment for clinical depression depends on the severity of your symptoms but typically includes antidepressant medicine, counseling with a mental health professional, or a combination of both. Your caregiver will help to determine what treatment is best for you. Depression caused by physical illness usually goes away with appropriate medical treatment of the illness. If prescription medicine is causing depression, talk with your caregiver about stopping the medicine, decreasing the dose, or substituting another medicine. Depression caused by abuse of alcohol or illicit drugs abuse goes away with abstinence from these substances. Some adults need professional help in order to stop drinking or  using drugs. SEEK IMMEDIATE CARE IF:  You have thoughts about hurting yourself or others.  You lose touch with reality (have psychotic symptoms).  You are taking medicine for depression and have a serious side effect. FOR MORE INFORMATION National [employer] Mental Illness: www.nami.[employer]: http://www.maynard.net/ Document Released: 10/16/2000 Document Revised: 04/19/2012 Document Reviewed: 01/18/2012 [facility] Patient Information 2013 [place], [place].

## 2013-03-08 NOTE — ED Provider Notes (Signed)
History     CSN: [id number]  Arrival date & time 03/08/13  1019   First MD Initiated Contact with Patient 03/08/13 1023      Chief Complaint  Patient presents with  . Drug Overdose  . Suicide Attempt    Pt took 10 aleve then called 911    (Consider location/radiation/quality/duration/timing/severity/associated sxs/prior treatment) Patient is a 42 y.o. female presenting with Overdose. The history is provided by the patient.  Drug Overdose Associated symptoms include abdominal pain. Pertinent negatives include no chest pain, no headaches and no shortness of breath.   patient states that she took 12 tablets of Aleve this morning in a suicide attempt. She she doesn't want to live anymore. She states she's had a lot of stress going on her life. She denies history of depression or suicidality. She now is complaining of mild nausea. No vomiting. She also has mild abdominal pain. No fevers. No cough. No confusion. No bleeding. She states she's otherwise healthy. She has a history of hypertension. Denies other drug use.   Past Medical History  Diagnosis Date  . Esophageal reflux   . Hypertension     Past Surgical History  Procedure Laterality Date  . Btl    . Knee surgery      Family History  Problem Relation Age of Onset  . Hypertension Mother   . Hypertension Father     History  Substance Use Topics  . Smoking status: Never Smoker   . Smokeless tobacco: Not on file  . Alcohol Use: Yes     Comment: SOCIAL    OB History   Grav Para Term Preterm Abortions TAB SAB Ect Mult Living                  Review of Systems  Constitutional: Negative for activity change and appetite change.  HENT: Negative for neck stiffness.   Eyes: Negative for pain.  Respiratory: Negative for chest tightness and shortness of breath.   Cardiovascular: Negative for chest pain and leg swelling.  Gastrointestinal: Positive for nausea and abdominal pain. Negative for vomiting and diarrhea.   Genitourinary: Negative for flank pain.  Musculoskeletal: Negative for back pain.  Skin: Negative for rash.  Neurological: Negative for weakness, numbness and headaches.  Psychiatric/Behavioral: Positive for suicidal ideas and dysphoric mood. Negative for behavioral problems.    Allergies  Review of patient's allergies indicates no known allergies.  Home Medications   Current Outpatient Rx  Name  Route  Sig  Dispense  Refill  . hydrochlorothiazide (HYDRODIURIL) 25 MG tablet   Oral   Take 25 mg by mouth daily.           Marland Kitchen ibuprofen (ADVIL,MOTRIN) 800 MG tablet   Oral   Take 800 mg by mouth every 8 (eight) hours as needed for pain.         . naproxen sodium (ANAPROX) 220 MG tablet   Oral   Take 220 mg by mouth 2 (two) times daily with a meal.         . omeprazole-sodium bicarbonate (ZEGERID) 40-1100 MG per capsule   Oral   Take 1 capsule by mouth daily before breakfast.           BP 110/60  Pulse 80  Temp(Src) 98.4 F (36.9 C) (Oral)  Resp 15  Ht 5\' 1"  (1.549 m)  Wt 155 lb (70.308 kg)  BMI 29.3 kg/m2  SpO2 100%  LMP 02/25/2013  Physical Exam  Nursing note and vitals  reviewed. Constitutional: She is oriented to person, place, and time. She appears well-developed and well-nourished.  HENT:  Head: Normocephalic and atraumatic.  Eyes: EOM are normal. Pupils are equal, round, and reactive to light.  Neck: Normal range of motion. Neck supple.  Cardiovascular: Normal rate, regular rhythm and normal heart sounds.   No murmur heard. Pulmonary/Chest: Effort normal and breath sounds normal. No respiratory distress. She has no wheezes. She has no rales.  Abdominal: Soft. Bowel sounds are normal. She exhibits no distension. There is tenderness. There is no rebound and no guarding.  Mild epigastric tenderness without rebound or guarding.  Musculoskeletal: Normal range of motion.  Neurological: She is alert and oriented to person, place, and time. No cranial nerve  deficit.  Skin: Skin is warm and dry.  Psychiatric: She has a normal mood and affect. Her speech is normal.  Patient is tearful    ED Course  Procedures (including critical care time)  Labs Reviewed  CBC WITH DIFFERENTIAL - Abnormal; Notable for the following:    HCT 35.5 (*)    All other components within normal limits  COMPREHENSIVE METABOLIC PANEL - Abnormal; Notable for the following:    Total Bilirubin 0.2 (*)    GFR calc non Af Amer 86 (*)    All other components within normal limits  URINE RAPID DRUG SCREEN (HOSP PERFORMED) - Abnormal; Notable for the following:    Tetrahydrocannabinol POSITIVE (*)    All other components within normal limits  SALICYLATE LEVEL - Abnormal; Notable for the following:    Salicylate Lvl <2.0 (*)    All other components within normal limits  ETHANOL  ACETAMINOPHEN LEVEL  URINALYSIS, ROUTINE W REFLEX MICROSCOPIC  PREGNANCY, URINE   No results found.   1. Overdose, initial encounter   2. Depression       MDM  Patient presents after an overdose on naproxen. Patient has had only some abdominal pain and nausea. I doubt a severe consequence to this suggestion. She also has depression with some suicidal thoughts. She states this was a suicide attempt. She has support system and place with a husband that was here. After being seen by ACT team she'll be followed in intensive outpatient treatment on Monday. I think it is reasonable to send her home to be watched by her family until then.        Juliet Rude. Rubin Payor, MD 03/08/13 1447

## 2013-03-08 NOTE — ED Notes (Signed)
Pt denies SI currently. Charge Faith-Maire aware. Will continue to monitor pt with door open.

## 2013-03-08 NOTE — ED Notes (Signed)
Bed:WA10<BR> Expected date:<BR> Expected time:<BR> Means of arrival:<BR> Comments:<BR>

## 2013-04-01 ENCOUNTER — Encounter (HOSPITAL_COMMUNITY): Payer: Self-pay | Admitting: Emergency Medicine

## 2013-04-01 ENCOUNTER — Emergency Department (HOSPITAL_COMMUNITY)
Admission: EM | Admit: 2013-04-01 | Discharge: 2013-04-01 | Disposition: A | Discharge status: Home / Self Care | Payer: 59 | Attending: Emergency Medicine | Admitting: Emergency Medicine

## 2013-04-01 DIAGNOSIS — Z79899 Other long term (current) drug therapy: Secondary | ICD-10-CM | POA: Insufficient documentation

## 2013-04-01 DIAGNOSIS — F1910 Other psychoactive substance abuse, uncomplicated: Secondary | ICD-10-CM | POA: Insufficient documentation

## 2013-04-01 DIAGNOSIS — S46909A Unspecified injury of unspecified muscle, fascia and tendon at shoulder and upper arm level, unspecified arm, initial encounter: Secondary | ICD-10-CM | POA: Insufficient documentation

## 2013-04-01 DIAGNOSIS — Y92410 Unspecified street and highway as the place of occurrence of the external cause: Secondary | ICD-10-CM | POA: Insufficient documentation

## 2013-04-01 DIAGNOSIS — K219 Gastro-esophageal reflux disease without esophagitis: Secondary | ICD-10-CM | POA: Insufficient documentation

## 2013-04-01 DIAGNOSIS — S0993XA Unspecified injury of face, initial encounter: Secondary | ICD-10-CM | POA: Insufficient documentation

## 2013-04-01 DIAGNOSIS — Y9389 Activity, other specified: Secondary | ICD-10-CM | POA: Insufficient documentation

## 2013-04-01 DIAGNOSIS — S4980XA Other specified injuries of shoulder and upper arm, unspecified arm, initial encounter: Secondary | ICD-10-CM | POA: Insufficient documentation

## 2013-04-01 DIAGNOSIS — I10 Essential (primary) hypertension: Secondary | ICD-10-CM | POA: Insufficient documentation
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
## 2013-04-02 NOTE — ED Provider Notes (Signed)
Medical screening examination/treatment/procedure(s) were performed by non-physician practitioner and as supervising physician I was immediately available for consultation/collaboration.   Glynn Octave, MD 04/02/13 [phone]

## 2013-06-26 ENCOUNTER — Other Ambulatory Visit: Payer: Self-pay

## 2013-06-26 DIAGNOSIS — Z1231 Encounter for screening mammogram for malignant neoplasm of breast: Secondary | ICD-10-CM

## 2013-07-21 ENCOUNTER — Ambulatory Visit: Payer: 59

## 2013-08-08 ENCOUNTER — Ambulatory Visit
Admission: RE | Admit: 2013-08-08 | Discharge: 2013-08-08 | Disposition: A | Discharge status: Home / Self Care | Payer: 59 | Source: Ambulatory Visit

## 2013-08-08 DIAGNOSIS — Z1231 Encounter for screening mammogram for malignant neoplasm of breast: Secondary | ICD-10-CM

## 2013-10-09 IMAGING — MG MM DIGITAL DIAGNOSTIC BILAT
5 series · 5 of 5 positions shown · non-contrast
Comparison: Multiple priors

CLINICAL DATA: Enlarging mass, left breast for the past 2 years.

DIGITAL DIAGNOSTIC BILATERAL MAMMOGRAM WITH CAD AND LEFT BREAST
ULTRASOUND:

[R CC]
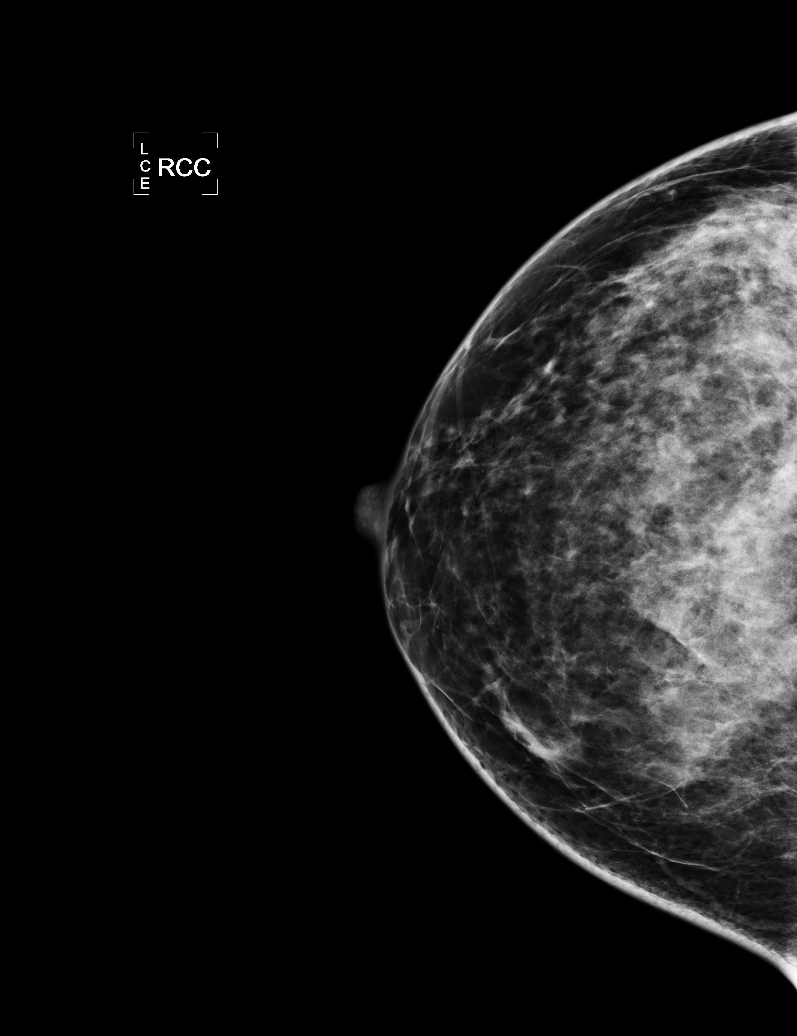

[L CC]
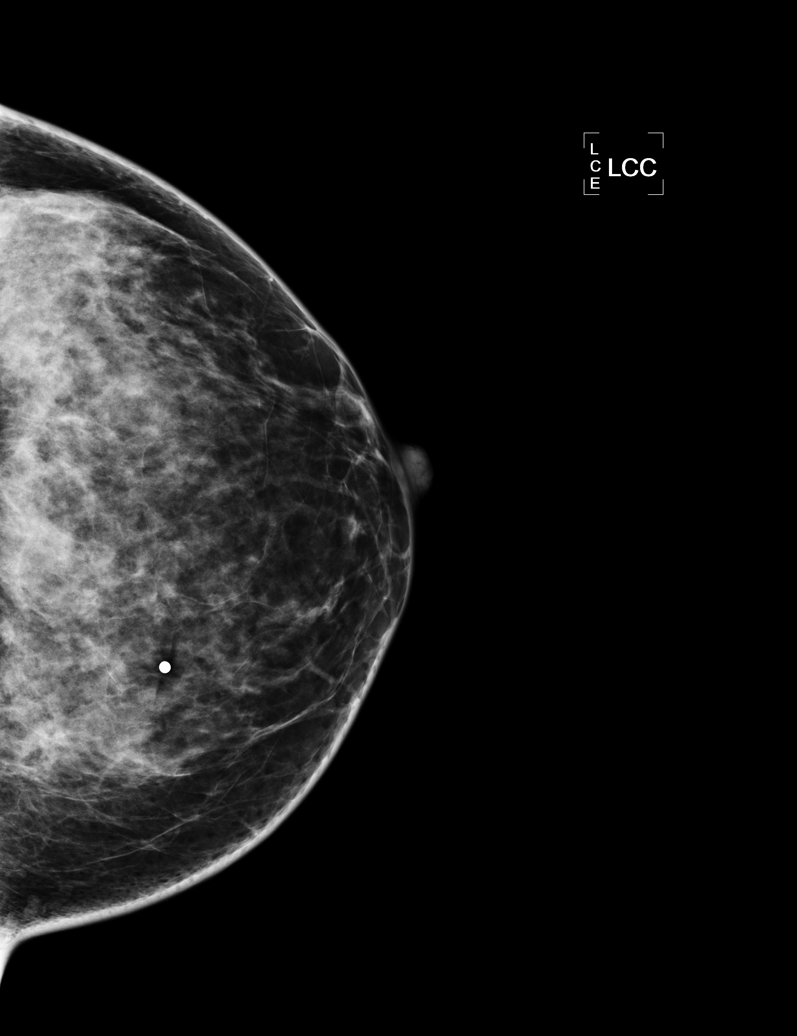

[L MLO]
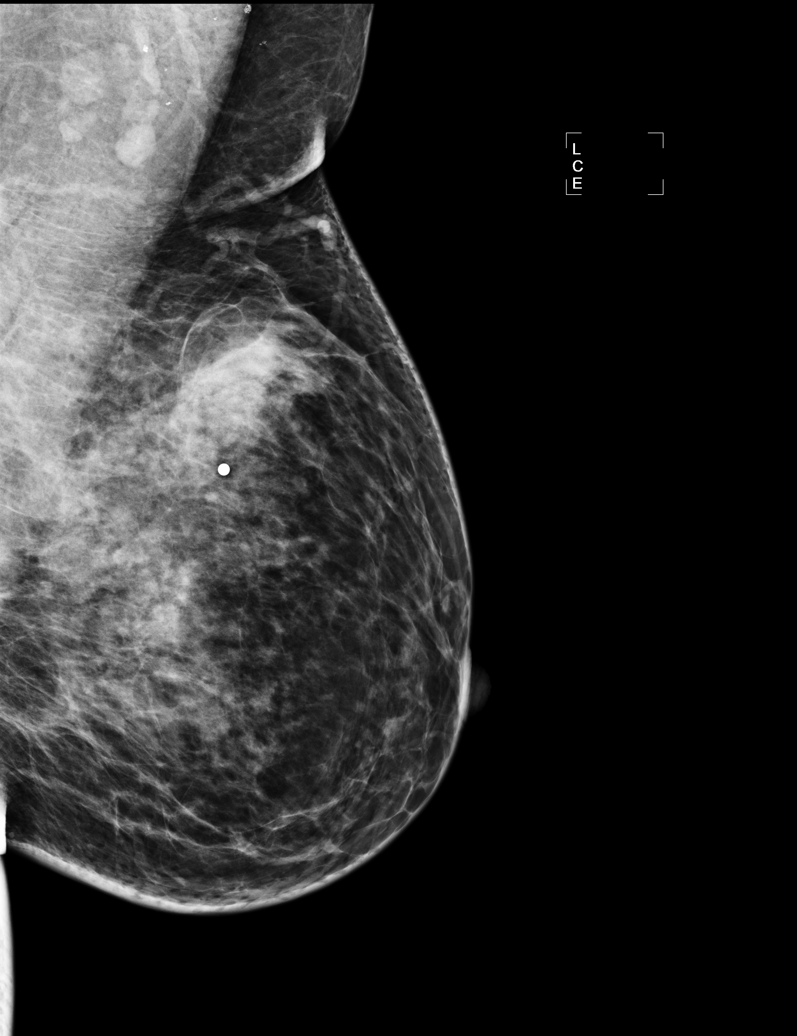

[R MLO]
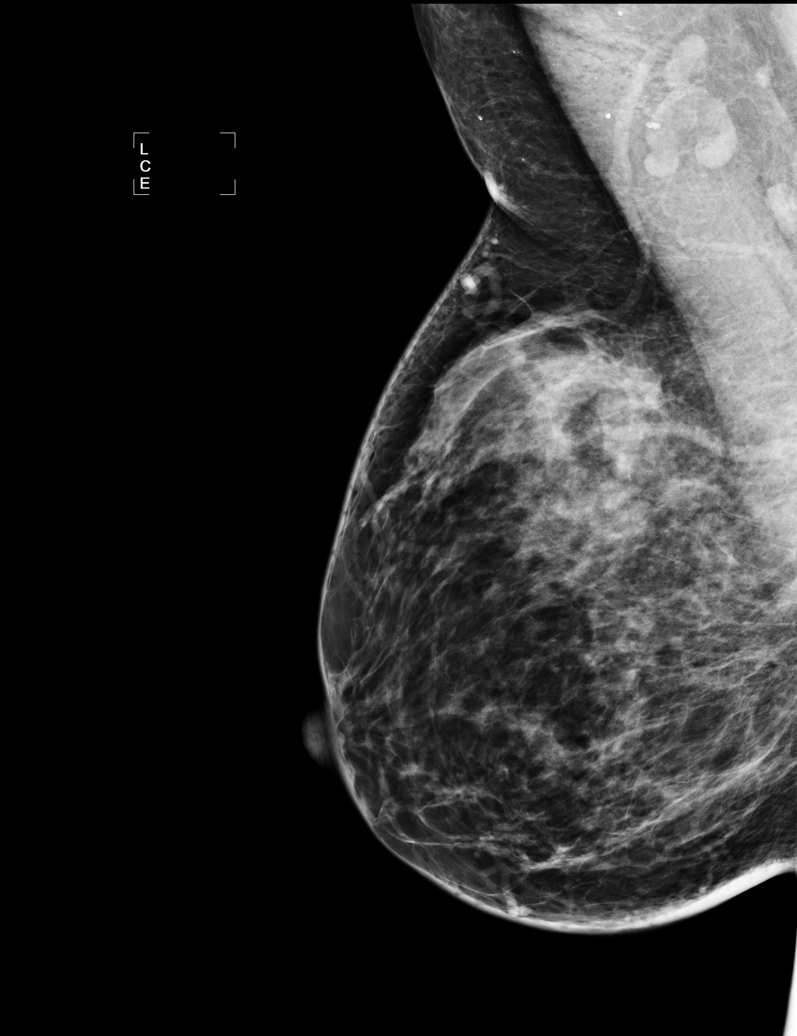

[L TAN]
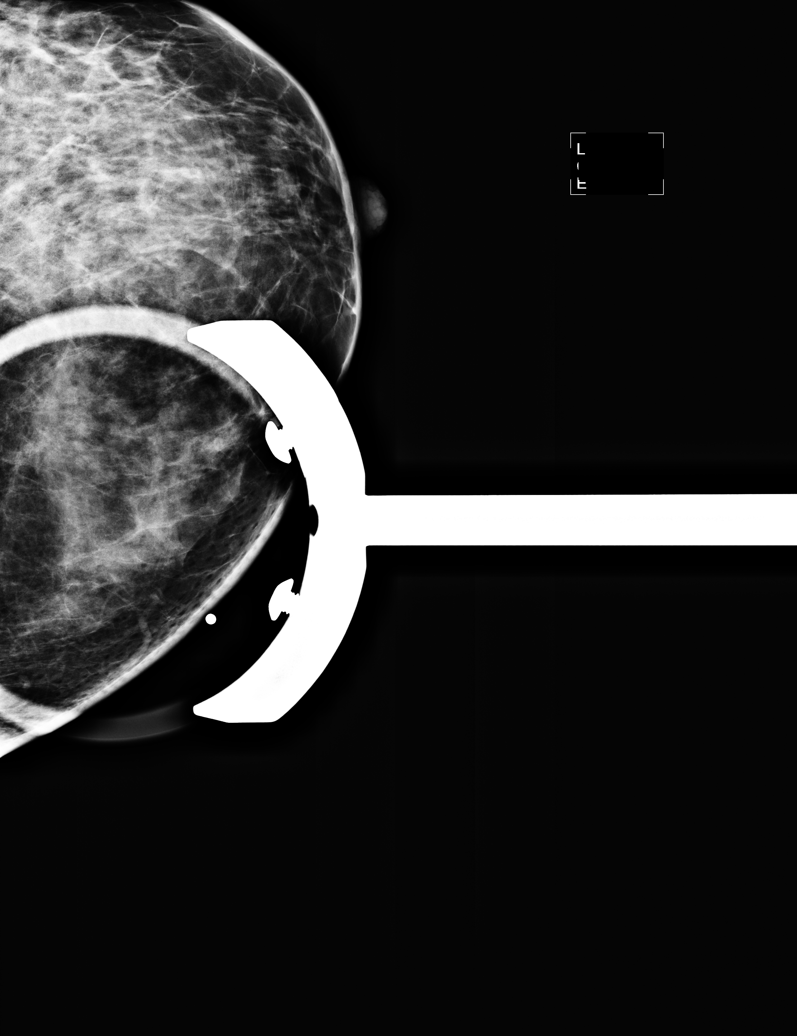

[5 of 5 positions shown; findings below may reference images not displayed]

FINDINGS: The breast tissue is heterogeneously dense.  No mass or
suspicious calcifications are seen in either breast.  Dense
glandular tissue is imaged on spot tangential view in the area
concern in the upper inner quadrant of the left breast.
Compared to priors. Mammographic images were processed with CAD.

On physical exam, I palpate a firm, mobile mass in the 11 o'clock
position, 9 cm the nipple.

Ultrasound is performed, showing an oval, hypoechoic mass with
internal septations and areas of posterior acoustic enhancement
measuring 1.0 x 0.4 x 0.8 cm.  This corresponds to the area of
clinical and palpable concern.  This may represent a cluster of
cysts.  Options were discussed the patient including 6-month follow-
up versus biopsy.
IMPRESSION: Mass, left breast for which biopsy is scheduled.

RECOMMENDATION:
Mass, left breast for which an ultrasound guided biopsy is
scheduled.

BI-RADS CATEGORY 3:  Probably benign finding(s) - short interval
follow-up suggested.

## 2013-11-11 ENCOUNTER — Emergency Department (HOSPITAL_COMMUNITY)
Admission: EM | Admit: 2013-11-11 | Discharge: 2013-11-11 | Disposition: A | Discharge status: Home / Self Care | Payer: 59 | Source: Home / Self Care | Attending: Family Medicine | Admitting: Family Medicine

## 2013-11-11 ENCOUNTER — Encounter (HOSPITAL_COMMUNITY): Payer: Self-pay | Admitting: Emergency Medicine

## 2013-11-11 DIAGNOSIS — J329 Chronic sinusitis, unspecified: Secondary | ICD-10-CM

## 2013-11-11 DIAGNOSIS — J069 Acute upper respiratory infection, unspecified: Secondary | ICD-10-CM

## 2013-11-11 DIAGNOSIS — B9789 Other viral agents as the cause of diseases classified elsewhere: Secondary | ICD-10-CM

## 2013-11-11 MED ORDER — GUAIFENESIN-CODEINE 100-10 MG/5ML PO SOLN: 5.0000 mL | Freq: Every evening | ORAL | Status: DC | PRN

## 2013-11-11 MED ORDER — IPRATROPIUM BROMIDE 0.06 % NA SOLN: 2.0000 | Freq: Four times a day (QID) | NASAL | Status: DC

## 2013-11-11 MED ORDER — PREDNISONE 10 MG PO TABS: 30.0000 mg | ORAL_TABLET | Freq: Every day | ORAL | Status: DC

## 2013-11-11 NOTE — ED Provider Notes (Signed)
Natalie Hunt is a 43 y.o. female who presents to Urgent Care today for nasal congestion and cough present for the last 6 days. Patient denies any fevers chills nausea vomiting diarrhea or shortness of breath. She's tried some over-the-counter medications which have not helped much. She denies any sick contacts. She notes sinus pressure but denies any pain. Symptoms are moderate.   Past Medical History  Diagnosis Date  . Esophageal reflux   . Hypertension    History  Substance Use Topics  . Smoking status: Never Smoker   . Smokeless tobacco: Not on file  . Alcohol Use: Yes     Comment: SOCIAL   ROS as above Medications reviewed. No current facility-administered medications for this encounter.   Current Outpatient Prescriptions  Medication Sig Dispense Refill  . clomiPHENE (CLOMID) 50 MG tablet Take 100 mg by mouth daily. For 5 days      . guaiFENesin-codeine 100-10 MG/5ML syrup Take 5 mLs by mouth at bedtime as needed for cough.  120 mL  0  . hydrochlorothiazide (HYDRODIURIL) 25 MG tablet Take 25 mg by mouth daily.        Marland Kitchen. ipratropium (ATROVENT) 0.06 % nasal spray Place 2 sprays into both nostrils 4 (four) times daily.  15 mL  1  . omeprazole-sodium bicarbonate (ZEGERID) 40-1100 MG per capsule Take 1 capsule by mouth daily before breakfast.      . predniSONE (DELTASONE) 10 MG tablet Take 3 tablets (30 mg total) by mouth daily.  15 tablet  0  . Prenatal Vit-Fe Fumarate-FA (PRENATAL PO) Take 1 tablet by mouth daily. [employer] brand prenatal vitamin      . SUMAtriptan (IMITREX) 100 MG tablet Take 100 mg by mouth every 2 (two) hours as needed for migraine.        Exam:  BP 133/88  Pulse 61  Temp(Src) 99.1 F (37.3 C) (Oral)  Resp 18  SpO2 100%  LMP 10/31/2013 Gen: Well NAD HEENT: EOMI,  MMM to be membranes are normal appearing bilaterally. Posterior pharynx with cobblestoning. Mild nasal turbinate injection bilaterally. Nontender maxillary sinuses. Lungs: Normal work of  breathing. CTABL Heart: RRR no MRG Abd: NABS, Soft. NT, ND Exts: Non edematous BL  LE, warm and well perfused.    Assessment and Plan: 43 y.o. female with URI and viral sinusitis. Plan treatment with symptomatic measures. Will use prednisone, codeine containing cough medication, and Atrovent nasal spray. Followup with primary care provider.  Discussed warning signs or symptoms. Please see discharge instructions. Patient expresses understanding.    Rodolph BongEvan S Janel Beane, MD 11/11/13 [phone]

## 2013-11-11 NOTE — ED Notes (Signed)
Pt c/o colds sxs onset Sunday w/sxs that include: cough w/green phlegm, chest/nasal congestion, runny nose, nose bleeds Denies: f/v/n/d, SOB, wheezing... Taking OTC mucinex w/no relief Alert w/no signs of acute distress.

## 2013-11-11 NOTE — Discharge Instructions (Signed)
Thank you for coming in today. Prednisone daily for 5 days. His cough medication at bedtime as needed. Use Atrovent nasal spray. Followup with her primary care provider as needed. Call or go to the emergency room if you get worse, have trouble breathing, have chest pains, or palpitations.

## 2014-01-15 ENCOUNTER — Other Ambulatory Visit: Payer: Self-pay | Admitting: Obstetrics & Gynecology

## 2014-01-26 ENCOUNTER — Ambulatory Visit
Admission: RE | Admit: 2014-01-26 | Discharge: 2014-01-26 | Disposition: A | Discharge status: Home / Self Care | Payer: 59 | Source: Ambulatory Visit | Attending: Family Medicine | Admitting: Family Medicine

## 2014-01-26 ENCOUNTER — Other Ambulatory Visit: Payer: Self-pay | Admitting: Family Medicine

## 2014-01-26 DIAGNOSIS — R05 Cough: Secondary | ICD-10-CM

## 2014-01-26 DIAGNOSIS — R059 Cough, unspecified: Secondary | ICD-10-CM

## 2014-07-10 ENCOUNTER — Other Ambulatory Visit: Payer: Self-pay | Admitting: Family Medicine

## 2014-07-10 DIAGNOSIS — N644 Mastodynia: Secondary | ICD-10-CM

## 2014-07-12 ENCOUNTER — Ambulatory Visit
Admission: RE | Admit: 2014-07-12 | Discharge: 2014-07-12 | Disposition: A | Discharge status: Home / Self Care | Payer: 59 | Source: Ambulatory Visit | Attending: Family Medicine | Admitting: Family Medicine

## 2014-07-12 ENCOUNTER — Encounter (INDEPENDENT_AMBULATORY_CARE_PROVIDER_SITE_OTHER): Payer: Self-pay

## 2014-07-12 DIAGNOSIS — N644 Mastodynia: Secondary | ICD-10-CM

## 2014-07-13 ENCOUNTER — Other Ambulatory Visit: Payer: 59

## 2014-10-16 ENCOUNTER — Other Ambulatory Visit (INDEPENDENT_AMBULATORY_CARE_PROVIDER_SITE_OTHER): Payer: Self-pay | Admitting: General Surgery

## 2014-10-16 DIAGNOSIS — R1010 Upper abdominal pain, unspecified: Secondary | ICD-10-CM

## 2014-10-17 ENCOUNTER — Encounter (INDEPENDENT_AMBULATORY_CARE_PROVIDER_SITE_OTHER): Payer: Self-pay

## 2014-10-17 ENCOUNTER — Ambulatory Visit
Admission: RE | Admit: 2014-10-17 | Discharge: 2014-10-17 | Disposition: A | Discharge status: Home / Self Care | Payer: 59 | Source: Ambulatory Visit | Attending: General Surgery | Admitting: General Surgery

## 2014-10-17 DIAGNOSIS — R1010 Upper abdominal pain, unspecified: Secondary | ICD-10-CM

## 2014-10-17 MED ORDER — IOHEXOL 300 MG/ML  SOLN
100.0000 mL | Freq: Once | INTRAMUSCULAR | Status: AC | PRN
  Administered 2014-10-17: 100 mL via INTRAVENOUS

## 2014-11-09 ENCOUNTER — Other Ambulatory Visit: Payer: Self-pay | Admitting: Gastroenterology

## 2015-01-23 ENCOUNTER — Other Ambulatory Visit: Payer: Self-pay | Admitting: Obstetrics & Gynecology

## 2015-01-25 LAB — CYTOLOGY - PAP

## 2015-03-04 ENCOUNTER — Other Ambulatory Visit (HOSPITAL_COMMUNITY): Payer: Self-pay | Admitting: Obstetrics & Gynecology

## 2015-03-26 ENCOUNTER — Other Ambulatory Visit: Payer: Self-pay | Admitting: Obstetrics & Gynecology

## 2015-03-26 ENCOUNTER — Ambulatory Visit
Admission: RE | Admit: 2015-03-26 | Discharge: 2015-03-26 | Disposition: A | Discharge status: Home / Self Care | Payer: 59 | Source: Ambulatory Visit | Attending: Obstetrics & Gynecology | Admitting: Obstetrics & Gynecology

## 2015-03-26 DIAGNOSIS — R1013 Epigastric pain: Secondary | ICD-10-CM

## 2015-03-26 MED ORDER — IOHEXOL 300 MG/ML  SOLN
100.0000 mL | Freq: Once | INTRAMUSCULAR | Status: AC | PRN
  Administered 2015-03-26: 100 mL via INTRAVENOUS

## 2015-06-26 ENCOUNTER — Other Ambulatory Visit: Payer: Self-pay

## 2015-06-26 DIAGNOSIS — Z1231 Encounter for screening mammogram for malignant neoplasm of breast: Secondary | ICD-10-CM

## 2015-07-01 ENCOUNTER — Ambulatory Visit
Admission: RE | Admit: 2015-07-01 | Discharge: 2015-07-01 | Disposition: A | Discharge status: Home / Self Care | Payer: 59 | Source: Ambulatory Visit | Attending: Family Medicine | Admitting: Family Medicine

## 2015-07-01 ENCOUNTER — Other Ambulatory Visit: Payer: Self-pay | Admitting: Family Medicine

## 2015-07-01 DIAGNOSIS — R1031 Right lower quadrant pain: Secondary | ICD-10-CM

## 2015-07-01 MED ORDER — IOPAMIDOL (ISOVUE-300) INJECTION 61%
100.0000 mL | Freq: Once | INTRAVENOUS | Status: AC | PRN
  Administered 2015-07-01: 100 mL via INTRAVENOUS

## 2015-07-15 ENCOUNTER — Ambulatory Visit: Payer: 59

## 2015-07-16 ENCOUNTER — Ambulatory Visit
Admission: RE | Admit: 2015-07-16 | Discharge: 2015-07-16 | Disposition: A | Discharge status: Home / Self Care | Payer: 59 | Source: Ambulatory Visit

## 2015-07-16 DIAGNOSIS — Z1231 Encounter for screening mammogram for malignant neoplasm of breast: Secondary | ICD-10-CM

## 2015-08-05 ENCOUNTER — Other Ambulatory Visit: Payer: Self-pay | Admitting: Obstetrics & Gynecology

## 2015-08-06 LAB — CYTOLOGY - PAP

## 2015-10-01 ENCOUNTER — Emergency Department (INDEPENDENT_AMBULATORY_CARE_PROVIDER_SITE_OTHER)
Admission: EM | Admit: 2015-10-01 | Discharge: 2015-10-01 | Disposition: A | Discharge status: Home / Self Care | Payer: 59 | Source: Home / Self Care

## 2015-10-01 ENCOUNTER — Encounter (HOSPITAL_COMMUNITY): Payer: Self-pay

## 2015-10-01 DIAGNOSIS — J0100 Acute maxillary sinusitis, unspecified: Secondary | ICD-10-CM | POA: Diagnosis not present

## 2015-10-01 MED ORDER — AMOXICILLIN 500 MG PO CAPS: 1000.0000 mg | ORAL_CAPSULE | Freq: Three times a day (TID) | ORAL | Status: DC

## 2015-10-01 MED ORDER — AMOXICILLIN 500 MG PO CAPS: 500.0000 mg | ORAL_CAPSULE | Freq: Three times a day (TID) | ORAL | Status: DC

## 2015-10-01 NOTE — ED Provider Notes (Signed)
CSN: [id number]     Arrival date & time 10/01/15  1812 History   None    Chief Complaint  Patient presents with  . Headache  . Dizziness   (Consider location/radiation/quality/duration/timing/severity/associated sxs/prior Treatment) HPI  History obtained from patient:     LOCATION:head SEVERITY:5 DURATION:1 CONTEXT:sudden onset QUALITY: sinus/facial pressure MODIFYING FACTORS: OTC meds without relief ASSOCIATED SYMPTOMS: mucous in eyes, headache TIMING:constant OCCUPATION:social worker    Past Medical History  Diagnosis Date  . Esophageal reflux   . Hypertension    Past Surgical History  Procedure Laterality Date  . Btl    . Knee surgery    . Tubal ligation  1999  . Tubal ligation  2014    reversal   Family History  Problem Relation Age of Onset  . Hypertension Mother   . Hypertension Father    Social History  Substance Use Topics  . Smoking status: Never Smoker   . Smokeless tobacco: None  . Alcohol Use: Yes     Comment: SOCIAL   OB History    No data available     Review of Systems ROS +'ve  Sinus pressure, headache  Denies:   NAUSEA, ABDOMINAL PAIN, CHEST PAIN, CONGESTION, DYSURIA, SHORTNESS OF BREATH  Allergies  Review of patient's allergies indicates no known allergies.  Home Medications   Prior to Admission medications   Medication Sig Start Date End Date Taking? Authorizing Provider  hydrochlorothiazide (HYDRODIURIL) 25 MG tablet Take 25 mg by mouth daily.     Yes Historical Provider, MD  ipratropium (ATROVENT) 0.06 % nasal spray Place 2 sprays into both nostrils 4 (four) times daily. 11/11/13  Yes Rodolph Bong, MD  omeprazole-sodium bicarbonate (ZEGERID) 40-1100 MG per capsule Take 1 capsule by mouth daily before breakfast.   Yes Historical Provider, MD  clomiPHENE (CLOMID) 50 MG tablet Take 100 mg by mouth daily. For 5 days 03/25/13   Historical Provider, MD  guaiFENesin-codeine 100-10 MG/5ML syrup Take 5 mLs by mouth at bedtime as needed  for cough. 11/11/13   Rodolph Bong, MD  predniSONE (DELTASONE) 10 MG tablet Take 3 tablets (30 mg total) by mouth daily. 11/11/13   Rodolph Bong, MD  Prenatal Vit-Fe Fumarate-FA (PRENATAL PO) Take 1 tablet by mouth daily. [employer] brand prenatal vitamin    Historical Provider, MD  SUMAtriptan (IMITREX) 100 MG tablet Take 100 mg by mouth every 2 (two) hours as needed for migraine.    Historical Provider, MD   Meds Ordered and Administered this Visit  Medications - No data to display  BP 176/113 mmHg  Pulse 60  Temp(Src) 98.2 F (36.8 C) (Oral)  Resp 18  SpO2 100%  LMP 09/18/2014 No data found.   Physical Exam  Constitutional: She appears well-developed and well-nourished. No distress.  HENT:  Head: Normocephalic and atraumatic.  Right Ear: External ear normal.  Left Ear: External ear normal.  Nose: Nose normal.  Mouth/Throat: Oropharynx is clear and moist.  Maxillary sinus tenderness. Bogginess of turbinates.   Eyes: Conjunctivae are normal.  Neck: Normal range of motion. Neck supple.  Cardiovascular: Normal rate.   Pulmonary/Chest: Effort normal and breath sounds normal. She has no wheezes.  Musculoskeletal: Normal range of motion.  Lymphadenopathy:    She has no cervical adenopathy.  Neurological: She is alert.  Skin: Skin is warm and dry.  Psychiatric: She has a normal mood and affect. Her behavior is normal. Judgment and thought content normal.  Nursing note and vitals reviewed.  ED Course  Procedures (including critical care time)  Labs Review Labs Reviewed - No data to display  Imaging Review No results found.   Visual Acuity Review  Right Eye Distance:   Left Eye Distance:   Bilateral Distance:    Right Eye Near:   Left Eye Near:    Bilateral Near:         MDM   1. Acute maxillary sinusitis, recurrence not specified    Rx: Amoxil F/u with pcp Activity as tolerated.  Follow up as needed Work note declined    Tharon AquasFrank C Tayelor Osborne, [place] 10/01/15  [phone]

## 2015-10-01 NOTE — ED Notes (Signed)
States she has been out of her BP medications since 11-25, her MD has reportedly not authorized refills. Also using OTC sinus medication for her HA, and this is not helping . Pain in her head and neck worse when she tries to turn head from side to side

## 2016-03-10 ENCOUNTER — Encounter: Payer: Self-pay | Admitting: Neurology

## 2016-03-10 ENCOUNTER — Ambulatory Visit (INDEPENDENT_AMBULATORY_CARE_PROVIDER_SITE_OTHER): Payer: 59 | Admitting: Neurology

## 2016-03-10 VITALS — BP 161/100 | HR 77 | Ht 61.0 in | Wt 162.2 lb

## 2016-03-10 DIAGNOSIS — M79609 Pain in unspecified limb: Secondary | ICD-10-CM | POA: Diagnosis not present

## 2016-03-10 DIAGNOSIS — R269 Unspecified abnormalities of gait and mobility: Secondary | ICD-10-CM | POA: Insufficient documentation

## 2016-03-10 DIAGNOSIS — R202 Paresthesia of skin: Secondary | ICD-10-CM | POA: Diagnosis not present

## 2016-03-10 MED ORDER — DULOXETINE HCL 60 MG PO CPEP: 60.0000 mg | ORAL_CAPSULE | Freq: Every day | ORAL | Status: DC

## 2016-03-10 MED ORDER — TRAZODONE HCL 50 MG PO TABS: 50.0000 mg | ORAL_TABLET | Freq: Every day | ORAL | Status: DC

## 2016-03-10 NOTE — Progress Notes (Signed)
PATIENT: Natalie Hunt DOB: 12/10/1970  Chief Complaint  Patient presents with  . Pain    She initially started having pain and numbness in her bilateral legs several months ago.  Now states symptoms are in various area of her body, along with muscle spasms.  She is also reporting intermittent blurred vision.      HISTORICAL  Natalie Hunt is a 45 years old right-handed female, seen in refer by her primary care physician Dr. Deatra James for evaluation of numbness tingling involving different body part in May ninth 2017  I reviewed and summarized referring note, she had a history of hypertension, migraine headache, acid reflux, hyperlipidemia, endometrial hyperplasia, irritable bowel syndrome,  Laboratory evaluation in 2017, negative RPR, chlamydia and, elevated cholesterol 245, LDL 155, hepatitis C, HIV, HSV 2 was negative, rheumatoid factor was less than 10, normal CBC with hemoglobin of 12.4, normal CMP, with creatinine of 0.79, normal CPK 82, B12 773 TSH 0.88 negative ANA, fasting glucose was mildly elevated with 102,  I reviewed and summarized in note from rheumatologist Dr. Cheryll Cockayne, patient presented with poly-arthralgia and myalgia, which is atypical for presentation of inflammatory arthritis or myositis, more extensive autoimmune serum markers was performed  Patient noted left big toe pain since summer of 2016, quickly spreading to left leg, left lateral ankle, later on involving bilateral lower and upper extremity muscles, she complains of constant from being pain, involving both upper and lower extremities, paresthesia, neck low back pain, difficulty walking because of body stiffness, ankle pain, difficulty bearing weight,  She also complains of difficulty falling to sleep, frequent awakening, urinary frequency, urgency, snoring, excessive daytime sleepiness, fatigue   REVIEW OF SYSTEMS: Full 14 system review of systems performed and notable only for swelling in legs,  blurred vision, diarrhea, constipation, urination problem, anemia, joint pain, swelling, achy muscles, confusion, headaches, numbness, weakness, sleepiness, restless leg, depression anxiety, not enough sleep  ALLERGIES: No Known Allergies  HOME MEDICATIONS: Current Outpatient Prescriptions  Medication Sig Dispense Refill  . amLODipine-benazepril (LOTREL) 10-20 MG capsule TK ONE C PO  ONCE A DAY  5  . esomeprazole (NEXIUM) 40 MG capsule TK 1 C PO QD  12  . LINZESS 290 MCG CAPS capsule TK 1 C PO QD  12  . VIVELLE-DOT 0.1 MG/24HR patch APP 1 PA EXT TO THE SKIN 2 TIMES A WK UTD  11   No current facility-administered medications for this visit.    PAST MEDICAL HISTORY: Past Medical History  Diagnosis Date  . Esophageal reflux   . Hypertension   . Pain   . Numbness   . Migraine   . IBS (irritable bowel syndrome)     PAST SURGICAL HISTORY: Past Surgical History  Procedure Laterality Date  . Btl    . Knee surgery  2007  . Tubal ligation  1999  . Tubal ligation  2014    reversal  . Abdominal hysterectomy      FAMILY HISTORY: Family History  Problem Relation Age of Onset  . Hypertension Mother   . Hypertension Father     SOCIAL HISTORY:  Social History   Social History  . Marital Status: Married    Spouse Name: N/A  . Number of Children: 2  . Years of Education: Masters   Occupational History  . Supervisor - Engineer, site    Social History Main Topics  . Smoking status: Never Smoker   . Smokeless tobacco: Not on file  . Alcohol Use: Yes  Comment: SOCIAL  . Drug Use: No  . Sexual Activity: Not on file   Other Topics Concern  . Not on file   Social History Narrative   Lives at home with her husband and children.   Right-handed.   0.5 -1 cup coffee per day.     PHYSICAL EXAM   Filed Vitals:   03/10/16 1610  BP: 161/100  Pulse: 77  Height: 5\' 1"  (1.549 m)  Weight: 162 lb 4 oz (73.596 kg)    Not recorded      Body mass index is 30.67  kg/(m^2).  PHYSICAL EXAMNIATION:  Gen: NAD, conversant, well nourised, obese, well groomed                     Cardiovascular: Regular rate rhythm, no peripheral edema, warm, nontender. Eyes: Conjunctivae clear without exudates or hemorrhage Neck: Supple, no carotid bruise. Pulmonary: Clear to auscultation bilaterally   NEUROLOGICAL EXAM:  MENTAL STATUS: Speech:    Speech is normal; fluent and spontaneous with normal comprehension.  Cognition:     Orientation to time, place and person     Normal recent and remote memory     Normal Attention span and concentration     Normal Language, naming, repeating,spontaneous speech     Fund of knowledge   CRANIAL NERVES: CN II: Visual fields are full to confrontation. Fundoscopic exam is normal with sharp discs and no vascular changes. Pupils are round equal and briskly reactive to light. CN III, IV, VI: extraocular movement are normal. No ptosis. CN V: Facial sensation is intact to pinprick in all 3 divisions bilaterally. Corneal responses are intact.  CN VII: Face is symmetric with normal eye closure and smile. CN VIII: Hearing is normal to rubbing fingers CN IX, X: Palate elevates symmetrically. Phonation is normal. CN XI: Head turning and shoulder shrug are intact CN XII: Tongue is midline with normal movements and no atrophy.  MOTOR: There is no pronator drift of out-stretched arms. Muscle bulk and tone are normal. Muscle strength is normal.  REFLEXES: Reflexes are 2+ and symmetric at the biceps, triceps, knees, and ankles. Plantar responses are flexor.  SENSORY: Intact to light touch, pinprick, positional sensation and vibratory sensation are intact in fingers and toes.  COORDINATION: Rapid alternating movements and fine finger movements are intact. There is no dysmetria on finger-to-nose and heel-knee-shin.    GAIT/STANCE: Posture is normal. Gait is steady with normal steps, base, arm swing, and turning. Heel and toe walking  are normal. Tandem gait is normal.  Romberg is absent.   DIAGNOSTIC DATA (LABS, IMAGING, TESTING) - I reviewed patient records, labs, notes, testing and imaging myself where available.   ASSESSMENT AND PLAN  Natalie Hunt is a 45 y.o. female   Diffuse body achy pain, 4 limb paresthesia, blurry vision  Need to rule out central nervous system etiology, patient desires complete evaluation,  Proceed with MRI of the brain, cervical spine,  EMG nerve conduction study  Cymbalta 60 mg daily  Chronic insomnia  Trazodone 50 mg every night   Levert FeinsteinYijun Jt Brabec, M.D. Ph.D.  [facility] [address], [address] [place], [place] [address] Ph: [phone] Fax: [phone]  CC: Deatra JamesVyvyan Sun, MD

## 2016-03-25 ENCOUNTER — Encounter: Payer: Self-pay | Admitting: Neurology

## 2016-03-25 ENCOUNTER — Ambulatory Visit (INDEPENDENT_AMBULATORY_CARE_PROVIDER_SITE_OTHER): Payer: 59

## 2016-03-25 DIAGNOSIS — M79609 Pain in unspecified limb: Secondary | ICD-10-CM | POA: Diagnosis not present

## 2016-03-25 DIAGNOSIS — R202 Paresthesia of skin: Secondary | ICD-10-CM

## 2016-03-25 DIAGNOSIS — R269 Unspecified abnormalities of gait and mobility: Secondary | ICD-10-CM

## 2016-04-06 ENCOUNTER — Encounter: Payer: Self-pay | Admitting: Neurology

## 2016-04-06 ENCOUNTER — Telehealth: Payer: Self-pay | Admitting: Neurology

## 2016-04-06 NOTE — Telephone Encounter (Signed)
Please let patient know, I have sent MRI results to her my chart, there was no significant abnormality on MRI of the brain and cervical spine

## 2016-04-06 NOTE — Telephone Encounter (Signed)
Returned call to patient - left detailed message for her (ok per DPR) - also left our number to call back with any questions.

## 2016-04-06 NOTE — Telephone Encounter (Signed)
Pt request MRI results.  °

## 2016-04-21 ENCOUNTER — Ambulatory Visit (INDEPENDENT_AMBULATORY_CARE_PROVIDER_SITE_OTHER): Payer: 59 | Admitting: Neurology

## 2016-04-21 ENCOUNTER — Ambulatory Visit (INDEPENDENT_AMBULATORY_CARE_PROVIDER_SITE_OTHER): Payer: Self-pay | Admitting: Neurology

## 2016-04-21 DIAGNOSIS — M79609 Pain in unspecified limb: Secondary | ICD-10-CM | POA: Diagnosis not present

## 2016-04-21 DIAGNOSIS — R202 Paresthesia of skin: Secondary | ICD-10-CM

## 2016-04-21 DIAGNOSIS — R269 Unspecified abnormalities of gait and mobility: Secondary | ICD-10-CM

## 2016-04-21 NOTE — Progress Notes (Signed)
PATIENT: Natalie Hunt DOB: 05/25/1971  No chief complaint on file.    HISTORICAL  Natalie Butcherylossa Thakur is a 45 years old right-handed female, seen in refer by her primary care physician Dr. Deatra JamesVyvyan Sun for evaluation of numbness tingling involving different body part in May ninth 2017  I reviewed and summarized referring note, she had a history of hypertension, migraine headache, acid reflux, hyperlipidemia, endometrial hyperplasia, irritable bowel syndrome,  Laboratory evaluation in 2017, negative RPR, chlamydia and, elevated cholesterol 245, LDL 155, hepatitis C, HIV, HSV 2 was negative, rheumatoid factor was less than 10, normal CBC with hemoglobin of 12.4, normal CMP, with creatinine of 0.79, normal CPK 82, B12 773 TSH 0.88 negative ANA, fasting glucose was mildly elevated with 102,  I reviewed and summarized in note from rheumatologist Dr. Cheryll CockayneAngela Hawkins, patient presented with poly-arthralgia and myalgia, which is atypical for presentation of inflammatory arthritis or myositis, more extensive autoimmune serum markers was performed  Patient noted left big toe pain since summer of 2016, quickly spreading to left leg, left lateral ankle, later on involving bilateral lower and upper extremity muscles, she complains of constant from being pain, involving both upper and lower extremities, paresthesia, neck low back pain, difficulty walking because of body stiffness, ankle pain, difficulty bearing weight,  She also complains of difficulty falling to sleep, frequent awakening, urinary frequency, urgency, snoring, excessive daytime sleepiness, fatigue  UPDATE April 21 2016: I personally reviewed MRI of the brain and cervical spine in May 2017, there was no significant abnormality. She was started on Cymbalta since last visit in May 2017, she only able to take the medicine at nighttime, because of side effect of drowsiness, it did help her body pain, but she still complains of diffuse body achy  pain, especially at the left foot, left leg, right arm  Patient returned for electrodiagnostic study today, which is normal, in specific, there is no evidence of right cervical radiculopathy, bilateral lumbosacral radiculopathy, or inflammatory myopathic changes.   REVIEW OF SYSTEMS: Full 14 system review of systems performed and notable only for as above    ALLERGIES: No Known Allergies  HOME MEDICATIONS: Current Outpatient Prescriptions  Medication Sig Dispense Refill  . amLODipine-benazepril (LOTREL) 10-20 MG capsule TK ONE C PO  ONCE A DAY  5  . DULoxetine (CYMBALTA) 60 MG capsule Take 1 capsule (60 mg total) by mouth daily. 30 capsule 11  . esomeprazole (NEXIUM) 40 MG capsule TK 1 C PO QD  12  . LINZESS 290 MCG CAPS capsule TK 1 C PO QD  12  . traZODone (DESYREL) 50 MG tablet Take 1 tablet (50 mg total) by mouth at bedtime. 30 tablet 11  . VIVELLE-DOT 0.1 MG/24HR patch APP 1 PA EXT TO THE SKIN 2 TIMES A WK UTD  11   No current facility-administered medications for this visit.    PAST MEDICAL HISTORY: Past Medical History  Diagnosis Date  . Esophageal reflux   . Hypertension   . Pain   . Numbness   . Migraine   . IBS (irritable bowel syndrome)     PAST SURGICAL HISTORY: Past Surgical History  Procedure Laterality Date  . Btl    . Knee surgery  2007  . Tubal ligation  1999  . Tubal ligation  2014    reversal  . Abdominal hysterectomy      FAMILY HISTORY: Family History  Problem Relation Age of Onset  . Hypertension Mother   . Hypertension Father  SOCIAL HISTORY:  Social History   Social History  . Marital Status: Married    Spouse Name: N/A  . Number of Children: 2  . Years of Education: Masters   Occupational History  . Supervisor - Engineer, site    Social History Main Topics  . Smoking status: Never Smoker   . Smokeless tobacco: Not on file  . Alcohol Use: Yes     Comment: SOCIAL  . Drug Use: No  . Sexual Activity: Not on file   Other  Topics Concern  . Not on file   Social History Narrative   Lives at home with her husband and children.   Right-handed.   0.5 -1 cup coffee per day.     PHYSICAL EXAM   There were no vitals filed for this visit.  Not recorded      There is no weight on file to calculate BMI.  PHYSICAL EXAMNIATION:  Gen: NAD, conversant, well nourised, obese, well groomed                     Cardiovascular: Regular rate rhythm, no peripheral edema, warm, nontender. Eyes: Conjunctivae clear without exudates or hemorrhage Neck: Supple, no carotid bruise. Pulmonary: Clear to auscultation bilaterally   NEUROLOGICAL EXAM:  MENTAL STATUS: Speech:    Speech is normal; fluent and spontaneous with normal comprehension.  Cognition:     Orientation to time, place and person     Normal recent and remote memory     Normal Attention span and concentration     Normal Language, naming, repeating,spontaneous speech     Fund of knowledge   CRANIAL NERVES: CN II: Visual fields are full to confrontation. Fundoscopic exam is normal with sharp discs and no vascular changes. Pupils are round equal and briskly reactive to light. CN III, IV, VI: extraocular movement are normal. No ptosis. CN V: Facial sensation is intact to pinprick in all 3 divisions bilaterally. Corneal responses are intact.  CN VII: Face is symmetric with normal eye closure and smile. CN VIII: Hearing is normal to rubbing fingers CN IX, X: Palate elevates symmetrically. Phonation is normal. CN XI: Head turning and shoulder shrug are intact CN XII: Tongue is midline with normal movements and no atrophy.  MOTOR: There is no pronator drift of out-stretched arms. Muscle bulk and tone are normal. Muscle strength is normal.  REFLEXES: Reflexes are 2+ and symmetric at the biceps, triceps, knees, and ankles. Plantar responses are flexor.  SENSORY: Intact to light touch, pinprick, positional sensation and vibratory sensation are intact in  fingers and toes.  COORDINATION: Rapid alternating movements and fine finger movements are intact. There is no dysmetria on finger-to-nose and heel-knee-shin.    GAIT/STANCE: Posture is normal. Gait is steady with normal steps, base, arm swing, and turning. Heel and toe walking are normal. Tandem gait is normal.  Romberg is absent.   DIAGNOSTIC DATA (LABS, IMAGING, TESTING) - I reviewed patient records, labs, notes, testing and imaging myself where available.   ASSESSMENT AND PLAN  Ikeya Brockel is a 45 y.o. female   Diffuse body achy pain, 4 limb paresthesia, blurry vision  no central, peripheral nervous system etiology found,  She has benefited from Cymbalta, continue 60 mg daily,  Chronic insomnia  Trazodone 50 mg every night   Levert Feinstein, M.D. Ph.D.  [facility] [address], [address] [place], [place] [address] Ph: [phone] Fax: [phone]  CC: Deatra James, MD

## 2016-04-21 NOTE — Procedures (Signed)
   NCS (NERVE CONDUCTION STUDY) WITH EMG (ELECTROMYOGRAPHY) REPORT   STUDY DATE: April 21 2016 PATIENT NAME: Natalie Hunt DOB: 05/29/1971 MRN: [id number]    TECHNOLOGIST: Gearldine ShownLorraine Jones ELECTROMYOGRAPHER: Levert FeinsteinYan, Jenness Stemler M.D.  CLINICAL INFORMATION:  [age] years old female presented with diffuse body achy pain.  FINDINGS: NERVE CONDUCTION STUDY: Bilateral peroneal sensory responses were normal. Bilateral peroneal to EDB, tibial motor responses were normal. Bilateral tibial H reflexes were present and symmetric. Bilateral ulnar, medial sensory and motor responses were normal.  NEEDLE ELECTROMYOGRAPHY: Selected needle examination was performed at bilateral lower extremity muscles, bilateral lumbar sacral paraspinal muscles, right upper extremity, right cervical paraspinal muscles.  Needle examination of bilateral tibialis anterior, peroneal longus, tibialis posterior, medial gastrocnemius, vastus lateralis, right iliopsoas muscle was normal.  There was no spontaneous activity at bilateral lumbar sacral paraspinal muscles, bilateral L3-4-5  Selected needle examination of right first dorsal interossei, extensor digitorum communis, pronator teres, biceps triceps deltoid was normal. There was no spontaneous activity at right cervical paraspinal muscles.  IMPRESSION:  This is a normal study, there is no electrodiagnostic evidence of upper or lower extremity neuropathy, there is no evidence of inflammatory myopathy.  INTERPRETING PHYSICIAN:   Levert FeinsteinYan, Kadence Mimbs M.D. Ph.D. [facility] [address], [address] [place], [place] [address] [phone]

## 2016-04-27 ENCOUNTER — Telehealth: Payer: Self-pay | Admitting: Neurology

## 2016-04-27 NOTE — Telephone Encounter (Signed)
Dr. Terrace ArabiaYan reviewed patient's chart (physical exam, MRI scans and NCV/EMG results) - no neurological problem identified to cause symptoms.  Patient aware that Dr. Terrace ArabiaYan would like for her to follow up with her PCP.

## 2016-04-27 NOTE — Telephone Encounter (Signed)
Patient called, states she has questions following her June 20th NCS/EMG.

## 2016-06-17 ENCOUNTER — Other Ambulatory Visit: Payer: Self-pay | Admitting: Family Medicine

## 2016-06-17 DIAGNOSIS — Z1231 Encounter for screening mammogram for malignant neoplasm of breast: Secondary | ICD-10-CM

## 2016-07-24 ENCOUNTER — Emergency Department (HOSPITAL_COMMUNITY)
Admission: EM | Admit: 2016-07-24 | Discharge: 2016-07-24 | Disposition: A | Discharge status: Home / Self Care | Payer: 59 | Attending: Emergency Medicine | Admitting: Emergency Medicine

## 2016-07-24 ENCOUNTER — Emergency Department (HOSPITAL_COMMUNITY): Payer: 59

## 2016-07-24 ENCOUNTER — Encounter (HOSPITAL_COMMUNITY): Payer: Self-pay | Admitting: Emergency Medicine

## 2016-07-24 DIAGNOSIS — R1084 Generalized abdominal pain: Secondary | ICD-10-CM | POA: Insufficient documentation

## 2016-07-24 DIAGNOSIS — R112 Nausea with vomiting, unspecified: Secondary | ICD-10-CM | POA: Insufficient documentation

## 2016-07-24 DIAGNOSIS — I10 Essential (primary) hypertension: Secondary | ICD-10-CM | POA: Insufficient documentation

## 2016-07-24 LAB — URINALYSIS, ROUTINE W REFLEX MICROSCOPIC
Bilirubin Urine: NEGATIVE
Glucose, UA: NEGATIVE mg/dL
Hgb urine dipstick: NEGATIVE
Ketones, ur: NEGATIVE mg/dL
Leukocytes, UA: NEGATIVE
Nitrite: NEGATIVE
Protein, ur: NEGATIVE mg/dL
Specific Gravity, Urine: 1.018 (ref 1.005–1.030)
pH: 6.5 (ref 5.0–8.0)

## 2016-07-24 LAB — COMPREHENSIVE METABOLIC PANEL
ALT: 14 U/L (ref 14–54)
AST: 24 U/L (ref 15–41)
Albumin: 4.4 g/dL (ref 3.5–5.0)
Alkaline Phosphatase: 99 U/L (ref 38–126)
Anion gap: 9 (ref 5–15)
BUN: 6 mg/dL (ref 6–20)
CO2: 23 mmol/L (ref 22–32)
Calcium: 10 mg/dL (ref 8.9–10.3)
Chloride: 107 mmol/L (ref 101–111)
Creatinine, Ser: 0.81 mg/dL (ref 0.44–1.00)
GFR calc Af Amer: >60 mL/min (ref >60)
GFR calc non Af Amer: >60 mL/min (ref >60)
Glucose, Bld: 92 mg/dL (ref 65–99)
Potassium: 3.7 mmol/L (ref 3.5–5.1)
Sodium: 139 mmol/L (ref 135–145)
Total Bilirubin: 0.6 mg/dL (ref 0.3–1.2)
Total Protein: 7.3 g/dL (ref 6.5–8.1)

## 2016-07-24 LAB — CBC
HCT: 39.4 % (ref 36.0–46.0)
Hemoglobin: 12.7 g/dL (ref 12.0–15.0)
MCH: 28.8 pg (ref 26.0–34.0)
MCHC: 32.2 g/dL (ref 30.0–36.0)
MCV: 89.3 fL (ref 78.0–100.0)
Platelets: 276 10*3/uL (ref 150–400)
RBC: 4.41 MIL/uL (ref 3.87–5.11)
RDW: 13.5 % (ref 11.5–15.5)
WBC: 5.2 10*3/uL (ref 4.0–10.5)

## 2016-07-24 LAB — I-STAT BETA HCG BLOOD, ED (MC, WL, AP ONLY)

## 2016-07-24 LAB — LIPASE, BLOOD: Lipase: 32 U/L (ref 11–51)

## 2016-07-24 LAB — I-STAT CG4 LACTIC ACID, ED: LACTIC ACID, VENOUS: 0.73 mmol/L (ref 0.5–1.9)

## 2016-07-24 MED ORDER — ONDANSETRON HCL 4 MG/2ML IJ SOLN
4.0000 mg | Freq: Once | INTRAMUSCULAR | Status: AC
  Administered 2016-07-24: 4 mg via INTRAVENOUS
  Filled 2016-07-24: qty 2

## 2016-07-24 MED ORDER — HYDROMORPHONE HCL 1 MG/ML IJ SOLN
0.5000 mg | Freq: Once | INTRAMUSCULAR | Status: AC
  Administered 2016-07-24: 0.5 mg via INTRAVENOUS
  Filled 2016-07-24: qty 1

## 2016-07-24 MED ORDER — IOPAMIDOL (ISOVUE-300) INJECTION 61%
INTRAVENOUS | Status: AC
  Administered 2016-07-24: 100 mL
  Filled 2016-07-24: qty 100

## 2016-07-24 NOTE — ED Triage Notes (Signed)
Pt arrives via POV from home with generalized abdominal pain and back pain since last night. States 2 episodes of nonbloody emesis last night and 2 this AM. Denies recent injury, fever/diarrhea. VSS.

## 2016-07-24 NOTE — ED Provider Notes (Signed)
[facility]-EMERGENCY DEPT Provider Note   CSN: [id number] Arrival date & time: 07/24/16  1131     History   Chief Complaint Chief Complaint  Patient presents with  . Abdominal Pain  . Back Pain    HPI Natalie Butcherylossa Quam is a 45 y.o. female.  HPI Natalie Hunt is a 45 y.o. female with history of acid reflux, hypertension, IBS, presents to emergency department complaining of abdominal pain, nausea, vomiting. Patient states the pain started yesterday. She reports several episodes of emesis. She states pain is all over her abdomen and radiates around to the back bilaterally. She states that she has had diarrhea which is not unusual for her. Denies any blood in her stool or emesis. Denies any fever or chills. She reports that she has never had similar pain in the past. She denies any urinary symptoms. History of hysterectomy. No other abdominal surgeries. Has not tried taking any medications for her symptoms at home prior to coming in.  movement and walking is making her symptoms worse, nothing is making it better.  Past Medical History:  Diagnosis Date  . Esophageal reflux   . Hypertension   . IBS (irritable bowel syndrome)   . Migraine   . Numbness   . Pain     Patient Active Problem List   Diagnosis Date Noted  . Pain in limb 03/10/2016  . Paresthesia 03/10/2016  . Abnormality of gait 03/10/2016    Past Surgical History:  Procedure Laterality Date  . ABDOMINAL HYSTERECTOMY    . BTL    . KNEE SURGERY  2007  . TUBAL LIGATION  1999  . TUBAL LIGATION  2014   reversal    OB History    No data available       Home Medications    Prior to Admission medications   Medication Sig Start Date End Date Taking? Authorizing Provider  amLODipine-benazepril (LOTREL) 10-20 MG capsule TK ONE C PO  ONCE A DAY 02/11/16   Historical Provider, MD  DULoxetine (CYMBALTA) 60 MG capsule Take 1 capsule (60 mg total) by mouth daily. 03/10/16   Levert FeinsteinYijun Yan, MD  esomeprazole (NEXIUM) 40 MG capsule TK  1 C PO QD 02/11/16   Historical Provider, MD  LINZESS 290 MCG CAPS capsule TK 1 C PO QD 02/17/16   Historical Provider, MD  traZODone (DESYREL) 50 MG tablet Take 1 tablet (50 mg total) by mouth at bedtime. 03/10/16   Levert FeinsteinYijun Yan, MD  VIVELLE-DOT 0.1 MG/24HR patch APP 1 PA EXT TO THE SKIN 2 TIMES A WK UTD 02/13/16   Historical Provider, MD    Family History Family History  Problem Relation Age of Onset  . Hypertension Father   . Hypertension Mother     Social History Social History  Substance Use Topics  . Smoking status: Never Smoker  . Smokeless tobacco: Not on file  . Alcohol use Yes     Comment: SOCIAL     Allergies   Review of patient's allergies indicates no known allergies.   Review of Systems Review of Systems  Constitutional: Negative for chills and fever.  Respiratory: Negative for cough, chest tightness and shortness of breath.   Cardiovascular: Negative for chest pain, palpitations and leg swelling.  Gastrointestinal: Positive for abdominal pain, diarrhea, nausea and vomiting.  Genitourinary: Negative for dysuria, flank pain and pelvic pain.  Musculoskeletal: Negative for arthralgias, myalgias, neck pain and neck stiffness.  Skin: Negative for rash.  Neurological: Negative for dizziness, weakness and headaches.  All  other systems reviewed and are negative.    Physical Exam Updated Vital Signs BP (!) 135/102   Pulse 72   Temp 98.2 F (36.8 C) (Oral)   Resp 18   LMP 09/18/2014   SpO2 100%   Physical Exam  Constitutional: She appears well-developed and well-nourished. No distress.  HENT:  Head: Normocephalic.  Eyes: Conjunctivae are normal.  Neck: Neck supple.  Cardiovascular: Normal rate, regular rhythm and normal heart sounds.   Pulmonary/Chest: Effort normal and breath sounds normal. No respiratory distress. She has no wheezes. She has no rales.  Abdominal: Bowel sounds are normal. She exhibits no distension. There is tenderness.  Diffusely tender  abdomen, and all quadrants. Tenderness is a none to the light touch. Patient screams in pain when I just barely touch her skin.  Musculoskeletal: She exhibits no edema.  Neurological: She is alert.  Skin: Skin is warm and dry.  Psychiatric: She has a normal mood and affect. Her behavior is normal.  Nursing note and vitals reviewed.    ED Treatments / Results  Labs (all labs ordered are listed, but only abnormal results are displayed) Labs Reviewed  LIPASE, BLOOD  COMPREHENSIVE METABOLIC PANEL  CBC  URINALYSIS, ROUTINE W REFLEX MICROSCOPIC (NOT AT [facility])  I-STAT BETA HCG BLOOD, ED ([facility], [facility], [facility] ONLY)  I-STAT CG4 LACTIC ACID, ED  I-STAT CG4 LACTIC ACID, ED    EKG  EKG Interpretation None       Radiology Ct Abdomen Pelvis W Contrast  Result Date: 07/24/2016 CLINICAL DATA:  45 year old female with lower abdominal pain, nausea and vomiting since yesterday. EXAM: CT ABDOMEN AND PELVIS WITH CONTRAST TECHNIQUE: Multidetector CT imaging of the abdomen and pelvis was performed using the standard protocol following bolus administration of intravenous contrast. CONTRAST:  ISOVUE-300 IOPAMIDOL (ISOVUE-300) INJECTION 61% COMPARISON:  07/01/2015, 03/26/2015 CT FINDINGS: Lower chest: Minimal left lower lobe sub segmental atelectasis. Tiny 3 mm lingular subpleural density unchanged relative to 03/26/2015 CT and may reflect a small focus of pleural thickening along the major fissure. Hepatobiliary: The hypodense focus adjacent to the falciform ligament is smaller in appearance estimated at 12 mm versus 17 mm on 03/26/2015. The liver is slightly hypodense in appearance which may reflect mild fatty infiltration or due to the timing of contrast bolus. Pancreas: Normal Spleen: Normal Adrenals/Urinary Tract: The kidneys enhance symmetrically without evidence of obstructive uropathy or nephrolithiasis. The adrenal glands are normal. Stomach/Bowel: Normal bowel rotation. No mechanical obstruction. Distal  descending and sigmoid diverticulosis without acute diverticulitis. Normal appendix. Vascular/Lymphatic: No retroperitoneal or mesenteric adenopathy. Normal caliber aorta. Reproductive: Prior hysterectomy and oophorectomy. Pessary in place. Other: No free air. Musculoskeletal: No acute osseous abnormality. IMPRESSION: Distal descending and sigmoid diverticulosis without acute diverticulitis. Smaller appearing hypodensity adjacent to the falciform ligament since prior. Electronically Signed   By: Tollie Eth M.D.   On: 07/24/2016 15:09    Procedures Procedures (including critical care time)  Medications Ordered in ED Medications  HYDROmorphone (DILAUDID) injection 0.5 mg (0.5 mg Intravenous Given 07/24/16 1331)  ondansetron (ZOFRAN) injection 4 mg (4 mg Intravenous Given 07/24/16 1331)     Initial Impression / Assessment and Plan / ED Course  I have reviewed the triage vital signs and the nursing notes.  Pertinent labs & imaging results that were available during my care of the patient were reviewed by me and considered in my medical decision making (see chart for details).  Clinical Course   Pt with Diffuse abdominal pain, onset yesterday. Reports some  nausea and vomiting. Appears to be in no acute distress. On examination of the abdomen, patient is tender to even lightest touch. With me barely touching the skin she begins to scream. Will check labs, lactic acid, urinalysis. Will get some pain medications.   3:23 PM CT scan obtained due to severity of pt's pain and is negative. I discussed results with pt. I was going to try to give her some bentyl but pt stated "I dont want to take any more medications, if you cant figure out with it is." I explained to her that we do not see a surgical process at this time, and that it does not mean she does not have any pain. She will need to follow up with her doctor/gi specialist.   Vitals:   07/24/16 1137  BP: (!) 135/102  Pulse: 72  Resp: 18    Temp: 98.2 F (36.8 C)  TempSrc: Oral  SpO2: 100%     Final Clinical Impressions(s) / ED Diagnoses   Final diagnoses:  Generalized abdominal pain    New Prescriptions Discharge Medication List as of 07/24/2016  3:27 PM       Jaynie Crumble, PA-C 07/24/16 1642    Raeford Razor, MD 07/25/16 [phone]

## 2016-07-24 NOTE — Discharge Instructions (Signed)
Your lab work and CT of abd/pelvis all negative. Drink plenty of fluids. Advance diet to solids as tolerate. Make sure to eat healthy foods. Follow up with your doctor or gastroenterologist.

## 2016-08-19 ENCOUNTER — Other Ambulatory Visit: Payer: Self-pay | Admitting: Obstetrics & Gynecology

## 2016-08-19 DIAGNOSIS — N6322 Unspecified lump in the left breast, upper inner quadrant: Secondary | ICD-10-CM

## 2016-08-19 DIAGNOSIS — N644 Mastodynia: Secondary | ICD-10-CM

## 2016-08-26 ENCOUNTER — Ambulatory Visit
Admission: RE | Admit: 2016-08-26 | Discharge: 2016-08-26 | Disposition: A | Discharge status: Home / Self Care | Payer: 59 | Source: Ambulatory Visit | Attending: Obstetrics & Gynecology | Admitting: Obstetrics & Gynecology

## 2016-08-26 DIAGNOSIS — N644 Mastodynia: Secondary | ICD-10-CM

## 2016-08-26 DIAGNOSIS — N6322 Unspecified lump in the left breast, upper inner quadrant: Secondary | ICD-10-CM

## 2017-07-05 ENCOUNTER — Emergency Department (HOSPITAL_COMMUNITY): Payer: Self-pay

## 2017-07-05 ENCOUNTER — Encounter (HOSPITAL_COMMUNITY): Payer: Self-pay | Admitting: *Deleted

## 2017-07-05 ENCOUNTER — Emergency Department (HOSPITAL_COMMUNITY)
Admission: EM | Admit: 2017-07-05 | Discharge: 2017-07-05 | Disposition: A | Discharge status: Home / Self Care | Payer: Self-pay | Attending: Emergency Medicine | Admitting: Emergency Medicine

## 2017-07-05 DIAGNOSIS — I10 Essential (primary) hypertension: Secondary | ICD-10-CM | POA: Insufficient documentation

## 2017-07-05 DIAGNOSIS — Y939 Activity, unspecified: Secondary | ICD-10-CM | POA: Insufficient documentation

## 2017-07-05 DIAGNOSIS — Y929 Unspecified place or not applicable: Secondary | ICD-10-CM | POA: Insufficient documentation

## 2017-07-05 DIAGNOSIS — Z79899 Other long term (current) drug therapy: Secondary | ICD-10-CM | POA: Insufficient documentation

## 2017-07-05 DIAGNOSIS — Y998 Other external cause status: Secondary | ICD-10-CM | POA: Insufficient documentation

## 2017-07-05 DIAGNOSIS — R40241 Glasgow coma scale score 13-15, unspecified time: Secondary | ICD-10-CM | POA: Insufficient documentation

## 2017-07-05 DIAGNOSIS — S0990XA Unspecified injury of head, initial encounter: Secondary | ICD-10-CM | POA: Insufficient documentation

## 2017-07-05 MED ORDER — ACETAMINOPHEN 500 MG PO TABS
1000.0000 mg | ORAL_TABLET | Freq: Once | ORAL | Status: AC
  Administered 2017-07-05: 1000 mg via ORAL
  Filled 2017-07-05: qty 2

## 2017-07-05 NOTE — ED Notes (Signed)
Patient transported to X-ray 
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
## 2017-07-05 NOTE — ED Notes (Signed)
ED Provider at bedside. 

## 2017-07-05 NOTE — Discharge Instructions (Signed)
Take Tylenol as directed for pain. Your blood pressure today was 149/98. Take your amlodipine as prescribed. Your blood pressure should be rechecked at your doctor's office in 3 weeks. Return if concern for any reason

## 2017-07-05 NOTE — ED Notes (Addendum)
This RN went to ED lobby to get pt. Pt speaking with [employer], and aware she is supposed to come back to ED room. Triage tech stating they will bring pt back to room.
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
## 2017-08-12 ENCOUNTER — Other Ambulatory Visit: Payer: Self-pay | Admitting: Obstetrics & Gynecology

## 2017-08-12 DIAGNOSIS — Z1231 Encounter for screening mammogram for malignant neoplasm of breast: Secondary | ICD-10-CM

## 2017-08-31 ENCOUNTER — Ambulatory Visit
Admission: RE | Admit: 2017-08-31 | Discharge: 2017-08-31 | Disposition: A | Discharge status: Home / Self Care | Payer: 59 | Source: Ambulatory Visit | Attending: Obstetrics & Gynecology | Admitting: Obstetrics & Gynecology

## 2017-08-31 DIAGNOSIS — Z1231 Encounter for screening mammogram for malignant neoplasm of breast: Secondary | ICD-10-CM

## 2017-11-28 ENCOUNTER — Encounter (HOSPITAL_COMMUNITY): Payer: Self-pay

## 2017-11-28 ENCOUNTER — Other Ambulatory Visit: Payer: Self-pay

## 2017-11-28 ENCOUNTER — Emergency Department (HOSPITAL_COMMUNITY): Payer: 59

## 2017-11-28 ENCOUNTER — Emergency Department (HOSPITAL_COMMUNITY)
Admission: EM | Admit: 2017-11-28 | Discharge: 2017-11-28 | Disposition: A | Discharge status: Home / Self Care | Payer: 59 | Attending: Emergency Medicine | Admitting: Emergency Medicine

## 2017-11-28 DIAGNOSIS — I10 Essential (primary) hypertension: Secondary | ICD-10-CM | POA: Diagnosis not present

## 2017-11-28 DIAGNOSIS — J101 Influenza due to other identified influenza virus with other respiratory manifestations: Secondary | ICD-10-CM | POA: Diagnosis not present

## 2017-11-28 DIAGNOSIS — R05 Cough: Secondary | ICD-10-CM | POA: Diagnosis present

## 2017-11-28 DIAGNOSIS — Z79899 Other long term (current) drug therapy: Secondary | ICD-10-CM | POA: Diagnosis not present

## 2017-11-28 LAB — INFLUENZA PANEL BY PCR (TYPE A & B)
INFLAPCR: POSITIVE — AB
Influenza B By PCR: NEGATIVE

## 2017-11-28 MED ORDER — BENZONATATE 100 MG PO CAPS: 100.0000 mg | ORAL_CAPSULE | Freq: Three times a day (TID) | ORAL | 0 refills | Status: DC

## 2017-11-28 MED ORDER — HYDROCOD POLST-CPM POLST ER 10-8 MG/5ML PO SUER
5.0000 mL | Freq: Once | ORAL | Status: AC
  Administered 2017-11-28: 5 mL via ORAL
  Filled 2017-11-28: qty 5

## 2017-11-28 MED ORDER — FLUTICASONE PROPIONATE 50 MCG/ACT NA SUSP: 2.0000 | Freq: Every day | NASAL | 12 refills | Status: DC

## 2017-11-28 MED ORDER — IPRATROPIUM-ALBUTEROL 0.5-2.5 (3) MG/3ML IN SOLN
3.0000 mL | Freq: Once | RESPIRATORY_TRACT | Status: AC
  Administered 2017-11-28: 3 mL via RESPIRATORY_TRACT
  Filled 2017-11-28: qty 3

## 2017-11-28 MED ORDER — KETOROLAC TROMETHAMINE 30 MG/ML IJ SOLN
15.0000 mg | Freq: Once | INTRAMUSCULAR | Status: AC
  Administered 2017-11-28: 15 mg via INTRAMUSCULAR
  Filled 2017-11-28: qty 1

## 2017-11-28 NOTE — ED Provider Notes (Signed)
[facility] EMERGENCY DEPARTMENT Provider Note   CSN: [id number] Arrival date & time: 11/28/17  0719     History   Chief Complaint Chief Complaint  Patient presents with  . Cough  . Chills    HPI Natalie Hunt is a 47 y.o. female.  HPI 47 year old African-American female with no pertinent past medical history presents to the emergency department today with 1 week of influenza-like illness.  Patient states that one week ago she developed rhinorrhea, nasal congestion, sore throat, productive cough.  Patient reports sick contacts with mother who was recently admitted to the hospital yesterday for influenza.  Patient reports intermittent chills and subjective fevers.  Has not taken her temperature at home.  Patient reports chest discomfort with cough.  Denies any associated shortness of breath or wheezing.  Patient has tried several over-the-counter medication with only little relief.  Nothing makes better or worse.  She also reports recent sick contact in the house with similar symptoms.  Patient denies any associated otalgia.  She does report sinus pressure and a headache.  Nothing makes her symptoms better or worse.  Tolerating p.o. fluids appropriately.  Port generalized body aches.  Denies any associated neck stiffness, abdominal pain, nausea, emesis, urinary symptoms. Past Medical History:  Diagnosis Date  . Esophageal reflux   . Hypertension   . IBS (irritable bowel syndrome)   . Migraine   . Numbness   . Pain     Patient Active Problem List   Diagnosis Date Noted  . Pain in limb 03/10/2016  . Paresthesia 03/10/2016  . Abnormality of gait 03/10/2016    Past Surgical History:  Procedure Laterality Date  . ABDOMINAL HYSTERECTOMY    . BTL    . KNEE SURGERY  2007  . TUBAL LIGATION  1999  . TUBAL LIGATION  2014   reversal    OB History    No data available       Home Medications    Prior to Admission medications   Medication Sig Start Date  End Date Taking? Authorizing Provider  amLODipine-benazepril (LOTREL) 10-20 MG capsule takes 1 capsule by mouth once daily 02/11/16   [provider]  DULoxetine (CYMBALTA) 60 MG capsule Take 1 capsule (60 mg total) by mouth daily. Patient not taking: Reported on 07/24/2016 03/10/16   Levert Feinstein, MD  esomeprazole (NEXIUM) 40 MG capsule takes 1 capsule by mouth once daily at night 02/11/16   [provider]  estradiol (ESTRING) 2 MG vaginal ring Place 2 mg vaginally every 3 (three) months. follow package directions    [provider]  Karlene Einstein 290 MCG CAPS capsule takies 1 capsule by mouth once daily at night 02/17/16   [provider]  traZODone (DESYREL) 50 MG tablet Take 1 tablet (50 mg total) by mouth at bedtime. Patient not taking: Reported on 07/24/2016 03/10/16   Levert Feinstein, MD  VIVELLE-DOT 0.1 MG/24HR patch APP 1 PA EXT TO THE SKIN 2 TIMES A WK UTD 02/13/16   [provider]    Family History Family History  Problem Relation Age of Onset  . Hypertension Father   . Hypertension Mother     Social History Social History   Tobacco Use  . Smoking status: Never Smoker  . Smokeless tobacco: Never Used  Substance Use Topics  . Alcohol use: Yes    Comment: SOCIAL  . Drug use: No     Allergies   Patient has no known allergies.   Review of  Systems Review of Systems  Constitutional: Positive for chills and fever.  HENT: Positive for congestion, rhinorrhea, sinus pressure, sinus pain, sneezing and sore throat. Negative for ear pain.   Respiratory: Positive for cough. Negative for wheezing.   Cardiovascular: Positive for chest pain (with cough).  Gastrointestinal: Negative for abdominal pain, nausea and vomiting.  Genitourinary: Negative for dysuria, frequency, hematuria and urgency.  Musculoskeletal: Positive for myalgias. Negative for neck pain and neck stiffness.  Skin: Negative for color change and rash.  Neurological: Positive for  headaches. Negative for weakness and numbness.     Physical Exam Updated Vital Signs BP (!) 133/95 (BP Location: Left Arm)   Pulse 88   Temp 99.2 F (37.3 C) (Oral)   Resp 16   LMP 09/18/2014   SpO2 99%   Physical Exam  Constitutional: She is oriented to person, place, and time. She appears well-developed and well-nourished. No distress.  HENT:  Head: Normocephalic and atraumatic.  Right Ear: Tympanic membrane, external ear and ear canal normal.  Left Ear: Tympanic membrane, external ear and ear canal normal.  Nose: Mucosal edema and rhinorrhea present. Right sinus exhibits maxillary sinus tenderness and frontal sinus tenderness. Left sinus exhibits maxillary sinus tenderness and frontal sinus tenderness.  Mouth/Throat: Uvula is midline and mucous membranes are normal. No trismus in the jaw. No uvula swelling. Posterior oropharyngeal erythema present. No oropharyngeal exudate, posterior oropharyngeal edema or tonsillar abscesses. No tonsillar exudate.  Eyes: Right eye exhibits no discharge. Left eye exhibits no discharge. No scleral icterus.  Neck: Normal range of motion. Neck supple.  No c spine midline tenderness. No paraspinal tenderness. No deformities or step offs noted. Full ROM. Supple. No nuchal rigidity.    Cardiovascular: Normal rate, regular rhythm and normal heart sounds.  Pulmonary/Chest: Effort normal and breath sounds normal. No stridor. No respiratory distress. She has no wheezes. She has no rales. She exhibits no tenderness.  Musculoskeletal: Normal range of motion.  Lymphadenopathy:    She has no cervical adenopathy.  Neurological: She is alert and oriented to person, place, and time.  Skin: Skin is warm and dry. Capillary refill takes less than 2 seconds. No pallor.  Psychiatric: Her behavior is normal. Judgment and thought content normal.  Nursing note and vitals reviewed.    ED Treatments / Results  Labs (all labs ordered are listed, but only abnormal  results are displayed) Labs Reviewed  INFLUENZA PANEL BY PCR (TYPE A & B) - Abnormal; Notable for the following components:      Result Value   Influenza A By PCR POSITIVE (*)    All other components within normal limits    EKG  EKG Interpretation None       Radiology Dg Chest 2 View  Result Date: 11/28/2017 CLINICAL DATA:  Cough and congestion EXAM: CHEST  2 VIEW COMPARISON:  01/26/2014 FINDINGS: The heart size and mediastinal contours are within normal limits. Both lungs are clear. The visualized skeletal structures are unremarkable. IMPRESSION: No active cardiopulmonary disease. Electronically Signed   By: Alcide CleverMark  Lukens M.D.   On: 11/28/2017 08:08    Procedures Procedures (including critical care time)  Medications Ordered in ED Medications  chlorpheniramine-HYDROcodone (TUSSIONEX) 10-8 MG/5ML suspension 5 mL (5 mLs Oral Given 11/28/17 0841)  ipratropium-albuterol (DUONEB) 0.5-2.5 (3) MG/3ML nebulizer solution 3 mL (3 mLs Nebulization Given 11/28/17 0842)  ketorolac (TORADOL) 30 MG/ML injection 15 mg (15 mg Intramuscular Given 11/28/17 0842)     Initial Impression / Assessment and Plan / ED  Course  I have reviewed the triage vital signs and the nursing notes.  Pertinent labs & imaging results that were available during my care of the patient were reviewed by me and considered in my medical decision making (see chart for details).     Patient with symptoms consistent with influenza.  Vitals are stable, low-grade fever.  No signs of dehydration, tolerating PO's.  Lungs are clear.  Chest x-ray was unremarkable for any focal infiltrate.  Patient was influenza A positive.  Discussed the cost versus benefit of Tamiflu treatment with the patient.  The patient understands that symptoms are greater than the recommended 24-48 hour window of treatment.  Patient will be discharged with instructions to orally hydrate, rest, and use over-the-counter medications such as anti-inflammatories  ibuprofen and Aleve for muscle aches and Tylenol for fever.  Patient will also be given a cough suppressant.    Final Clinical Impressions(s) / ED Diagnoses   Final diagnoses:  Influenza A    ED Discharge Orders        Ordered    benzonatate (TESSALON) 100 MG capsule  Every 8 hours     11/28/17 0941    fluticasone (FLONASE) 50 MCG/ACT nasal spray  Daily     11/28/17 0941       Rise Mu, PA-C 11/28/17 1610    Gerhard Munch, MD 11/28/17 1646

## 2017-11-28 NOTE — ED Triage Notes (Addendum)
Pt states she has cough, congestion, chills, fatigue. Concerned she has the flu. Afebrile in triage. Skin warm and dry, no distress noted. Pt reports headache as well.

## 2017-11-28 NOTE — Discharge Instructions (Signed)
Your flu test was positive.  Take the Tessalon for cough.  Use the Flonase for nasal congestion.  Use a nasal saline spray over-the-counter to help keep your nose moist.  Continue taking TheraFlu over-the-counter to help with decongesting.  He also need to take Motrin and Tylenol to make sure that you keep her fever down and body aches improved.  Follow-up with your primary care doctor return to the ED with any worsening symptoms.  Make sure you perform hand hygiene.

## 2017-12-08 ENCOUNTER — Ambulatory Visit (INDEPENDENT_AMBULATORY_CARE_PROVIDER_SITE_OTHER): Payer: 59 | Admitting: Neurology

## 2017-12-08 ENCOUNTER — Other Ambulatory Visit: Payer: Self-pay

## 2017-12-08 ENCOUNTER — Encounter: Payer: Self-pay | Admitting: Neurology

## 2017-12-08 VITALS — BP 147/100 | HR 76 | Ht 62.0 in | Wt 145.5 lb

## 2017-12-08 DIAGNOSIS — M6281 Muscle weakness (generalized): Secondary | ICD-10-CM

## 2017-12-08 DIAGNOSIS — M79601 Pain in right arm: Secondary | ICD-10-CM | POA: Insufficient documentation

## 2017-12-08 NOTE — Progress Notes (Signed)
PATIENT: Natalie Hunt DOB: [birth year]  Chief Complaint  Patient presents with  . New Patient (Initial Visit)    RM EMG 3, alone. Paper referral from Venita Lick, MD for right arm weakness.   Marland Kitchen PCP    Deatra James, MD  . Extremity Weakness    Right arm weak and has gotten worse over the last year. Cannot pick things up with right arm. Has seen Dr. Aundria Rud at [facility] ortho. Has gotten ESI in neck for pain. She had previous MRI. Brought CD with her. They are questioning possible hx stroke.      HISTORICAL Natalie Hunt is a 47 years old female, seen in refer by  Yolonda Kida, MD, Deatra James, MD, right arm weakness, paresthesia, initial evaluation was on December 08, 2017.  I saw her previously eating 2017, at that time, she complains of numbness tingling involving different parts,   She has past medical history of hypertension, migraine headaches, hyperlipidemia, acid reflux, endometrial hyperplasia, irritable bowel syndromes.  EMG nerve conduction study on April 21, 2016 was normal, Extensive laboratory evaluation in 2017 showed no significant abnormality, negative RPR, chlamydia and, elevated cholesterol 245, LDL 155, hepatitis C, HIV, HSV 2 was negative, rheumatoid factor was less than 10, normal CBC with hemoglobin of 12.4, normal CMP, with creatinine of 0.79, normal CPK 82, B12 773 TSH 0.88 negative ANA, fasting glucose was mildly elevated with 102,  She was also seen by rheumatologist Dr. Cheryll Cockayne, and reviewed her note, patient presented with poly-arthralgia and myalgia, which is atypical for presentation of inflammatory arthritis or myositis, more extensive autoimmune serum markers was performed  Patient noted left big toe pain since summer of 2016, quickly spreading to left leg, left lateral ankle, later on involving bilateral lower and upper extremity muscles, she complains of constant from being pain, involving both upper and lower extremities, paresthesia, neck  low back pain, difficulty walking because of body stiffness, ankle pain, difficulty bearing weight,  MRI of the brain and cervical spine in May 2017, there was no significant abnormality. She was started on Cymbalta since last visit in May 2017, she only able to take the medicine at nighttime, because of side effect of drowsiness, it did help her body pain, but she still complains of diffuse body achy pain, especially at the left foot, left leg, right arm  Today her focus is in her right arm, wear a tight right wrist splint, she complains of right hand weakness, numbness, drop things from her right hand, difficulty opening a bottle  REVIEW OF SYSTEMS: Full 14 system review of systems performed and notable only for headache, numbness, speech difficulty, weakness, depression, joint pain, swelling, achy muscles, muscle cramps, walking difficulty, neck pain, stiffness, constipation, restless leg, frequent awakening, leg swelling, cough, trouble swallowing, activity change right hand paresthesia and pain  ALLERGIES: No Known Allergies  HOME MEDICATIONS: Current Outpatient Medications  Medication Sig Dispense Refill  . amLODipine-benazepril (LOTREL) 10-20 MG capsule takes 1 capsule by mouth once daily  5  . esomeprazole (NEXIUM) 40 MG capsule takes 1 capsule by mouth once daily at night  12  . fluticasone (FLONASE) 50 MCG/ACT nasal spray Place 2 sprays into both nostrils daily. 16 g 12   No current facility-administered medications for this visit.     PAST MEDICAL HISTORY: Past Medical History:  Diagnosis Date  . Esophageal reflux   . Hypertension   . IBS (irritable bowel syndrome)   . Migraine   . Numbness   .  Pain     PAST SURGICAL HISTORY: Past Surgical History:  Procedure Laterality Date  . ABDOMINAL HYSTERECTOMY    . BTL    . KNEE SURGERY  2007  . TUBAL LIGATION  1999  . TUBAL LIGATION  2014   reversal    FAMILY HISTORY: Family History  Problem Relation Age of Onset  .  Hypertension Father   . Hypertension Mother     SOCIAL HISTORY:  Social History   Socioeconomic History  . Marital status: Married    Spouse name: Not on file  . Number of children: 2  . Years of education: Masters  . Highest education level: Not on file  Social Needs  . Financial resource strain: Not on file  . Food insecurity - worry: Not on file  . Food insecurity - inability: Not on file  . Transportation needs - medical: Not on file  . Transportation needs - non-medical: Not on file  Occupational History  . 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 Smoking status: Never Smoker  . Smokeless tobacco: Never Used  Substance and Sexual Activity  . Alcohol use: Yes    Comment: SOCIAL  . Drug use: No  . Sexual activity: Not on file  Other Topics Concern  . Not on file  Social History Narrative   Lives at home with her husband and children.   Right-handed.   One drink one-two times per week- coffee      PHYSICAL EXAM   Vitals:   12/08/17 0716  BP: (!) 147/100  Pulse: 76  Weight: 145 lb 8 oz (66 kg)  Height: 5\' 2"  (1.575 m)    Not recorded      Body mass index is 26.61 kg/m.  PHYSICAL EXAMNIATION:  Gen: NAD, conversant, well nourised, obese, well groomed                     Cardiovascular: Regular rate rhythm, no peripheral edema, warm, nontender. Eyes: Conjunctivae clear without exudates or hemorrhage Neck: Supple, no carotid bruits. Pulmonary: Clear to auscultation bilaterally   NEUROLOGICAL EXAM:  MENTAL STATUS: Speech:    Speech is normal; fluent and spontaneous with normal comprehension.  Cognition:     Orientation to time, place and person     Normal recent and remote memory     Normal Attention span and concentration     Normal Language, naming, repeating,spontaneous speech     Fund of knowledge   CRANIAL NERVES: CN II: Visual fields are full to confrontation. Fundoscopic exam is normal with sharp discs and no vascular  changes. Pupils are round equal and briskly reactive to light. CN III, IV, VI: extraocular movement are normal. No ptosis. CN V: Facial sensation is intact to pinprick in all 3 divisions bilaterally. Corneal responses are intact.  CN VII: Face is symmetric with normal eye closure and smile. CN VIII: Hearing is normal to rubbing fingers CN IX, X: Palate elevates symmetrically. Phonation is normal. CN XI: Head turning and shoulder shrug are intact CN XII: Tongue is midline with normal movements and no atrophy.  MOTOR: There is no pronator drift of out-stretched arms. Muscle bulk and tone are normal. Muscle strength is normal.  REFLEXES: Reflexes are 2+ and symmetric at the biceps, triceps, knees, and ankles. Plantar responses are flexor.  SENSORY: Intact to light touch, pinprick, positional sensation and vibratory sensation are intact in fingers and toes.  COORDINATION: Rapid alternating movements and fine finger movements are  intact. There is no dysmetria on finger-to-nose and heel-knee-shin.    GAIT/STANCE: Posture is normal. Gait is steady with normal steps, base, arm swing, and turning. Heel and toe walking are normal. Tandem gait is normal.  Romberg is absent.   DIAGNOSTIC DATA (LABS, IMAGING, TESTING) - I reviewed patient records, labs, notes, testing and imaging myself where available.   ASSESSMENT AND PLAN  Serita Butcherylossa Monestime is a 47 y.o. female   Right hand paresthesia and pain  EMG nerve conduction study to rule out right upper extremity neuropathy,  Laboratory evaluation for treatable etiology  MRI of brain  Levert FeinsteinYijun Jacari Iannello, M.D. Ph.D.  [facility] [address], [address] [place], [place] [address] Ph: [phone] Fax: [phone]  CC: Yolonda Kidaogers, Jason Patrick, MD, Deatra JamesSun, Vyvyan, MD

## 2017-12-09 ENCOUNTER — Telehealth: Payer: Self-pay | Admitting: Neurology

## 2017-12-09 LAB — HIV ANTIBODY (ROUTINE TESTING W REFLEX): HIV Screen 4th Generation wRfx: NONREACTIVE

## 2017-12-09 LAB — ACETYLCHOLINE RECEPTOR, BINDING: AChR Binding Ab, Serum: <0.03 nmol/L (ref 0.00–0.24)

## 2017-12-09 LAB — CBC WITH DIFFERENTIAL/PLATELET
BASOS ABS: 0 10*3/uL (ref 0.0–0.2)
Basos: 0 %
EOS (ABSOLUTE): 0 10*3/uL (ref 0.0–0.4)
EOS: 1 %
HEMATOCRIT: 40.1 % (ref 34.0–46.6)
HEMOGLOBIN: 13.1 g/dL (ref 11.1–15.9)
Immature Grans (Abs): 0 10*3/uL (ref 0.0–0.1)
Immature Granulocytes: 0 %
LYMPHS: 48 %
Lymphocytes Absolute: 2.4 10*3/uL (ref 0.7–3.1)
MCH: 29.8 pg (ref 26.6–33.0)
MCHC: 32.7 g/dL (ref 31.5–35.7)
MCV: 91 fL (ref 79–97)
MONOCYTES: 4 %
Monocytes Absolute: 0.2 10*3/uL (ref 0.1–0.9)
Neutrophils Absolute: 2.3 10*3/uL (ref 1.4–7.0)
Neutrophils: 47 %
Platelets: 292 10*3/uL (ref 150–379)
RBC: 4.4 x10E6/uL (ref 3.77–5.28)
RDW: 14.8 % (ref 12.3–15.4)
WBC: 5 10*3/uL (ref 3.4–10.8)

## 2017-12-09 LAB — COMPREHENSIVE METABOLIC PANEL
ALBUMIN: 4.6 g/dL (ref 3.5–5.5)
ALK PHOS: 81 IU/L (ref 39–117)
ALT: 26 IU/L (ref 0–32)
AST: 28 IU/L (ref 0–40)
Albumin/Globulin Ratio: 1.8 (ref 1.2–2.2)
BUN / CREAT RATIO: 14 (ref 9–23)
BUN: 10 mg/dL (ref 6–24)
Bilirubin Total: 0.3 mg/dL (ref 0.0–1.2)
CALCIUM: 10.4 mg/dL — AB (ref 8.7–10.2)
CO2: 22 mmol/L (ref 20–29)
CREATININE: 0.71 mg/dL (ref 0.57–1.00)
Chloride: 105 mmol/L (ref 96–106)
GFR calc non Af Amer: 104 mL/min/{1.73_m2} (ref >59)
GFR, EST AFRICAN AMERICAN: 120 mL/min/{1.73_m2} (ref >59)
GLOBULIN, TOTAL: 2.6 g/dL (ref 1.5–4.5)
GLUCOSE: 95 mg/dL (ref 65–99)
Potassium: 3.9 mmol/L (ref 3.5–5.2)
Sodium: 142 mmol/L (ref 134–144)
TOTAL PROTEIN: 7.2 g/dL (ref 6.0–8.5)

## 2017-12-09 LAB — RPR: RPR: NONREACTIVE

## 2017-12-09 LAB — ANA W/REFLEX: ANA: NEGATIVE

## 2017-12-09 LAB — VITAMIN D 25 HYDROXY (VIT D DEFICIENCY, FRACTURES): VIT D 25 HYDROXY: 13.1 ng/mL — AB (ref 30.0–100.0)

## 2017-12-09 LAB — C-REACTIVE PROTEIN: CRP: 0.5 mg/L (ref 0.0–4.9)

## 2017-12-09 LAB — TSH: TSH: 1.11 u[IU]/mL (ref 0.450–4.500)

## 2017-12-09 LAB — CK: CK TOTAL: 45 U/L (ref 24–173)

## 2017-12-09 LAB — SEDIMENTATION RATE: Sed Rate: 58 mm/hr — ABNORMAL HIGH (ref 0–32)

## 2017-12-09 LAB — VITAMIN B12: VITAMIN B 12: 1029 pg/mL (ref 232–1245)

## 2017-12-09 NOTE — Telephone Encounter (Signed)
Please call patient, extensive laboratory evaluation showed vitamin D3 deficiency, level was 13, would benefit from D3 supplement, 3000 units daily, will repeat level in 6 months,  There is also evidence of elevated ESR , but normal C-reactive protein,unknown clinical significance  Rest of the laboratory evaluation showed no significant abnormality.

## 2017-12-10 ENCOUNTER — Ambulatory Visit (INDEPENDENT_AMBULATORY_CARE_PROVIDER_SITE_OTHER): Payer: 59 | Admitting: Neurology

## 2017-12-10 ENCOUNTER — Encounter (INDEPENDENT_AMBULATORY_CARE_PROVIDER_SITE_OTHER): Payer: 59

## 2017-12-10 ENCOUNTER — Telehealth: Payer: Self-pay | Admitting: Neurology

## 2017-12-10 DIAGNOSIS — Z0289 Encounter for other administrative examinations: Secondary | ICD-10-CM

## 2017-12-10 DIAGNOSIS — M79601 Pain in right arm: Secondary | ICD-10-CM

## 2017-12-10 NOTE — Telephone Encounter (Signed)
LMOM with below lab results and rec. to take otc Vit. D 3,000iu once daily.  She does not need to return this call unless she has questions/fim

## 2017-12-10 NOTE — Procedures (Signed)
        Full Name: Natalie Hunt Gender: Female MRN #: [id number] Date of Birth: Sep 11, 1971    Visit Date: 12/10/17 11:27 Age: [age] Years 8 Months Old Examining Physician: Levert FeinsteinYijun Jenea Dake, MD  Referring Physician: Terrace ArabiaYan, MD History: 47 years old female, presented with right arm paresthesia, pain, and weakness  Summary of the tests:  Nerve conduction study: Right median ulnar sensory and motor responses were normal.  Right radial sensory response was normal.   Electromyography: Selective needle examinations of right upper extremity muscles and right cervical paraspinal muscles were normal.   Conclusion:  This is a normal study.  There is no electrodiagnostic evidence of right upper extremity neuropathy, right cervical radiculopathy.   ------------------------------- Levert FeinsteinYIjun Satya Bohall, M.D.  [facility] [address] [place], [place] [address] Tel: [phone] Fax: [phone]        [facility]    Nerve / Sites Muscle Latency Ref. Amplitude Ref. Rel Amp Segments Distance Velocity Ref. Area    ms ms mV mV %  cm m/s m/s mVms  R Median - APB     Wrist APB 2.8 ?4.4 9.2 ?4.0 100 Wrist - APB 7   28.6     Upper arm APB 6.3  7.4  80.7 Upper arm - Wrist 21 60 ?49 22.5  R Ulnar - ADM     Wrist ADM 2.1 ?3.3 9.8 ?6.0 100 Wrist - ADM 7   21.1     B.Elbow ADM 5.4  7.8  80.4 B.Elbow - Wrist 18 55 ?49 18.7     A.Elbow ADM 7.1  8.2  105 A.Elbow - B.Elbow 10 56 ?49 18.5         A.Elbow - Wrist             SNC    Nerve / Sites Rec. Site Peak Lat Ref.  Amp Ref. Segments Distance    ms ms V V  cm  R Radial - Anatomical snuff box (Forearm)     Forearm Wrist 2.3 ?2.9 28 ?15 Forearm - Wrist 10  R Median - Orthodromic (Dig II, Mid palm)     Dig II Wrist 2.6 ?3.4 34 ?10 Dig II - Wrist 13  R Ulnar - Orthodromic, (Dig V, Mid palm)     Dig V Wrist 2.3 ?3.1 14 ?5 Dig V - Wrist 1111           F  Wave    Nerve F Lat Ref.   ms ms  R Ulnar - ADM 25.1 ?32.0       EMG full       EMG Summary  Table    Spontaneous MUAP Recruitment  Muscle IA Fib PSW Fasc Other Amp Dur. Poly Pattern  R. First dorsal interosseous Normal None None None _______ Normal Normal Normal Normal  R. Pronator teres Normal None None None _______ Normal Normal Normal Normal  R. Triceps brachii Normal None None None _______ Normal Normal Normal Normal  R. Deltoid Normal None None None _______ Normal Normal Normal Normal  R. Biceps brachii Normal None None None _______ Normal Normal Normal Normal  R. Cervical paraspinals Normal None None None _______ Normal Normal Normal Normal

## 2017-12-10 NOTE — Telephone Encounter (Signed)
I received an after hours call with the message: pain medication not called in.  I called back, but VM did not state her name, so I did not leave a message.  I reviewed Dr. Zannie CoveYan's note, but found no evidence of a pain medication Rx mentioned. If it is a controlled substance I will advise patient, that it cannot be send after hours. Will encourage patient to call back during regular office hours to discuss, if she calls back.

## 2017-12-13 NOTE — Telephone Encounter (Signed)
Per vo by Dr. Terrace ArabiaYan, she would like to hold off on pain medications and proceed with MRI.  Spoke to patient - she is agreeable to this plan.

## 2017-12-13 NOTE — Telephone Encounter (Signed)
I just got off the phone with the patient to remind her about her MRI appointment for tomorrow for our [facility] mobile unit. She informed me she hasn't received a call yet about her pain medicine.

## 2017-12-13 NOTE — Telephone Encounter (Signed)
Pt has called back with questions re: her results.  Plesae call

## 2017-12-13 NOTE — Telephone Encounter (Signed)
Spoke to patient - she is aware of the results below and verbalized understanding.

## 2017-12-14 ENCOUNTER — Ambulatory Visit: Payer: 59

## 2017-12-14 DIAGNOSIS — M6281 Muscle weakness (generalized): Secondary | ICD-10-CM

## 2017-12-15 ENCOUNTER — Telehealth: Payer: Self-pay | Admitting: Neurology

## 2017-12-15 NOTE — Telephone Encounter (Signed)
Pt has called and made the request that Dr Terrace ArabiaYan [facility]  Pt did not make a request for a particular Neurologist .  Please call

## 2017-12-15 NOTE — Telephone Encounter (Signed)
Spoke with patient - completed her NCV/EMG (12/10/17) and MRI (12/15/17).  She just wants to come in for follow up to discuss results and next step treatment plan.  An appt has been scheduled for her on 12/20/17.

## 2017-12-20 ENCOUNTER — Encounter: Payer: Self-pay | Admitting: Neurology

## 2017-12-20 ENCOUNTER — Ambulatory Visit (INDEPENDENT_AMBULATORY_CARE_PROVIDER_SITE_OTHER): Payer: 59 | Admitting: Neurology

## 2017-12-20 VITALS — BP 131/93 | HR 101 | Ht 62.0 in | Wt 148.0 lb

## 2017-12-20 DIAGNOSIS — M79601 Pain in right arm: Secondary | ICD-10-CM | POA: Diagnosis not present

## 2017-12-20 DIAGNOSIS — M79609 Pain in unspecified limb: Secondary | ICD-10-CM

## 2017-12-20 MED ORDER — DULOXETINE HCL 60 MG PO CPEP: 60.0000 mg | ORAL_CAPSULE | Freq: Every day | ORAL | 12 refills | Status: DC

## 2017-12-20 NOTE — Progress Notes (Signed)
PATIENT: Natalie Hunt DOB: [birth year]  Chief Complaint  Patient presents with  . Right arm pain/weakness    She would like to further review her labs, MRI and NCV/EMG results.  She would also like to discuss next step/treatment options.     HISTORICAL Natalie Hunt is a 47 years old female, seen in refer by  Nicholes Stairs, MD, Donald Prose, MD, right arm weakness, paresthesia, initial evaluation was on December 08, 2017.  I saw her previously in 2017, at that time, she complains of numbness tingling involving different parts,   She has past medical history of hypertension, migraine headaches, hyperlipidemia, acid reflux, endometrial hyperplasia, irritable bowel syndromes.  EMG nerve conduction study on April 21, 2016 was normal, Extensive laboratory evaluation in 2017 showed no significant abnormality, negative RPR, chlamydia and, elevated cholesterol 245, LDL 155, hepatitis C, HIV, HSV 2 was negative, rheumatoid factor was less than 10, normal CBC with hemoglobin of 12.4, normal CMP, with creatinine of 0.79, normal CPK 82, B12 773 TSH 0.88 negative ANA, fasting glucose was mildly elevated with 102,  She was also seen by rheumatologist Dr. Lenn Sink, and reviewed her note, patient presented with poly-arthralgia and myalgia, which is atypical for presentation of inflammatory arthritis or myositis, more extensive autoimmune serum markers was performed  Patient noted left big toe pain since summer of 2016, quickly spreading to left leg, left lateral ankle, later on involving bilateral lower and upper extremity muscles, she complains of constant from being pain, involving both upper and lower extremities, paresthesia, neck low back pain, difficulty walking because of body stiffness, ankle pain, difficulty bearing weight,  MRI of the brain and cervical spine in May 2017, there was no significant abnormality. She was started on Cymbalta since last visit in May 2017, she only  able to take the medicine at nighttime, because of side effect of drowsiness, it did help her body pain, but she still complains of diffuse body achy pain, especially at the left foot, left leg, right arm  Today her focus is in her right arm, wear a tight right wrist splint, she complains of right hand weakness, numbness, drop things from her right hand, difficulty opening a bottle  Update December 20, 2017: EMG nerve conduction study in February 2019 was normal, in specific, there is no evidence of right upper extremity neuropathy MRI of the brain on December 15, 2017 was normal Extensive laboratory evaluation showed normal negative, acetylcholine receptor antibody, CPK, TSH, B12, RPR, C-reactive protein, HIV, ANA, CMP, CBC Vitamin D deficiency level was 13, mild elevated ESR 58, with  normal C-reactive protein.  She was at pain management, received epidural injection to her neck in Jan 2019, did not help her.  She has tried Advil, Tylenol without helping her symptoms,  She is tearful during today's interview, complains of diffuse body achy pain, gait abnormality, low back pain, wear her right wrist splint,  REVIEW OF SYSTEMS: Full 14 system review of systems performed and notable only for cough, leg swelling, joint pain, swelling, back pain, achy muscles, muscle cramps, walking difficulty neck pain, stiffness, restless leg, daytime sleepiness, headaches, numbness, speech difficulty, weakness, depression  ALLERGIES: No Known Allergies  HOME MEDICATIONS: Current Outpatient Medications  Medication Sig Dispense Refill  . amLODipine-benazepril (LOTREL) 10-20 MG capsule takes 1 capsule by mouth once daily  5  . Cholecalciferol (VITAMIN D3) 3000 units TABS Take 3,000 Units by mouth daily.    Marland Kitchen esomeprazole (NEXIUM) 40 MG capsule takes 1  capsule by mouth once daily at night  12  . estradiol (ESTRACE) 2 MG tablet Take 2 mg by mouth daily.     No current facility-administered medications for  this visit.     PAST MEDICAL HISTORY: Past Medical History:  Diagnosis Date  . Esophageal reflux   . Hypertension   . IBS (irritable bowel syndrome)   . Migraine   . Numbness   . Pain     PAST SURGICAL HISTORY: Past Surgical History:  Procedure Laterality Date  . ABDOMINAL HYSTERECTOMY    . BTL    . KNEE SURGERY  2007  . TUBAL LIGATION  1999  . TUBAL LIGATION  2014   reversal    FAMILY HISTORY: Family History  Problem Relation Age of Onset  . Hypertension Father   . Hypertension Mother     SOCIAL HISTORY:  Social History   Socioeconomic History  . Marital status: Married    Spouse name: Not on file  . Number of children: 2  . Years of education: Masters  . Highest education level: Not on file  Social Needs  . Financial resource strain: Not on file  . Food insecurity - worry: Not on file  . Food insecurity - inability: Not on file  . Transportation needs - medical: Not on file  . Transportation needs - non-medical: Not on file  Occupational History  . Occupation: Librarian, academic - Orthoptist  Tobacco Use  . Smoking status: Never Smoker  . Smokeless tobacco: Never Used  Substance and Sexual Activity  . Alcohol use: Yes    Comment: SOCIAL  . Drug use: No  . Sexual activity: Not on file  Other Topics Concern  . Not on file  Social History Narrative   Lives at home with her husband and children.   Right-handed.   One drink one-two times per week- coffee      PHYSICAL EXAM   Vitals:   12/20/17 1018  BP: (!) 131/93  Pulse: (!) 101  Weight: 148 lb (67.1 kg)  Height: _0  (1.575 m)    Not recorded      Body mass index is 27.07 kg/m.  PHYSICAL EXAMNIATION:  Gen: NAD, conversant, well nourised, obese, well groomed                     Cardiovascular: Regular rate rhythm, no peripheral edema, warm, nontender. Eyes: Conjunctivae clear without exudates or hemorrhage Neck: Supple, no carotid bruits. Pulmonary: Clear to auscultation bilaterally    NEUROLOGICAL EXAM:  MENTAL STATUS: Speech:    Speech is normal; fluent and spontaneous with normal comprehension.  Cognition:     Orientation to time, place and person     Normal recent and remote memory     Normal Attention span and concentration     Normal Language, naming, repeating,spontaneous speech     Fund of knowledge   CRANIAL NERVES: CN II: Visual fields are full to confrontation. Fundoscopic exam is normal with sharp discs and no vascular changes. Pupils are round equal and briskly reactive to light. CN III, IV, VI: extraocular movement are normal. No ptosis. CN V: Facial sensation is intact to pinprick in all 3 divisions bilaterally. Corneal responses are intact.  CN VII: Face is symmetric with normal eye closure and smile. CN VIII: Hearing is normal to rubbing fingers CN IX, X: Palate elevates symmetrically. Phonation is normal. CN XI: Head turning and shoulder shrug are intact CN XII: Tongue is midline with  normal movements and no atrophy.  MOTOR: There is no pronator drift of out-stretched arms. Muscle bulk and tone are normal. Muscle strength is normal.  REFLEXES: Reflexes are 2+ and symmetric at the biceps, triceps, knees, and ankles. Plantar responses are flexor.  SENSORY: Intact to light touch, pinprick, positional sensation and vibratory sensation are intact in fingers and toes.  COORDINATION: Rapid alternating movements and fine finger movements are intact. There is no dysmetria on finger-to-nose and heel-knee-shin.    GAIT/STANCE: Variable effort on examinations, antalgic cautious gait   DIAGNOSTIC DATA (LABS, IMAGING, TESTING) - I reviewed patient records, labs, notes, testing and imaging myself where available.   ASSESSMENT AND PLAN  Alaiya Martindelcampo is a 47 y.o. female   Diffuse body achy pain,  Extensive laboratory evaluations failed to demonstrate etiology,  Mild elevated ESR, was normal C-reactive protein, less likely indicating an  inflammatory process, but will repeat laboratory evaluations,  Cymbalta 60 mg daily,  We have run extensive neurological evaluation, there is no neurological etiology for her complaints, she is to continue follow-up with her primary care physician,  Marcial Pacas, M.D. Ph.D.  [facility] [address], [place], [place] [address] Ph: [phone] Fax: [phone]  CC: Nicholes Stairs, MD, Donald Prose, MD

## 2017-12-21 ENCOUNTER — Telehealth: Payer: Self-pay | Admitting: Neurology

## 2017-12-21 LAB — C-REACTIVE PROTEIN: CRP: 5.9 mg/L — AB (ref 0.0–4.9)

## 2017-12-21 LAB — SEDIMENTATION RATE: Sed Rate: 52 mm/hr — ABNORMAL HIGH (ref 0–32)

## 2017-12-21 NOTE — Telephone Encounter (Addendum)
Spoke to patient - she is aware of labs results and will follow up with PCP.

## 2017-12-21 NOTE — Telephone Encounter (Signed)
Please call patient, repeat laboratory evaluation continues show mild elevated ESR, C-reactive protein,  I have faxed the laboratory results to her primary care, this can be continually monitored by her primary care physician.

## 2017-12-23 DIAGNOSIS — Z0271 Encounter for disability determination: Secondary | ICD-10-CM

## 2018-06-22 ENCOUNTER — Other Ambulatory Visit: Payer: Self-pay | Admitting: Pain Medicine

## 2018-06-22 DIAGNOSIS — M545 Low back pain: Secondary | ICD-10-CM

## 2018-07-22 ENCOUNTER — Ambulatory Visit
Admission: RE | Admit: 2018-07-22 | Discharge: 2018-07-22 | Disposition: A | Discharge status: Home / Self Care | Payer: 59 | Source: Ambulatory Visit | Attending: Pain Medicine | Admitting: Pain Medicine

## 2018-07-22 DIAGNOSIS — M545 Low back pain: Secondary | ICD-10-CM

## 2018-07-29 ENCOUNTER — Other Ambulatory Visit: Payer: Self-pay | Admitting: Obstetrics & Gynecology

## 2018-07-29 DIAGNOSIS — Z1231 Encounter for screening mammogram for malignant neoplasm of breast: Secondary | ICD-10-CM

## 2018-08-04 ENCOUNTER — Encounter (HOSPITAL_COMMUNITY): Payer: Self-pay

## 2018-08-04 ENCOUNTER — Emergency Department (HOSPITAL_COMMUNITY)
Admission: EM | Admit: 2018-08-04 | Discharge: 2018-08-04 | Disposition: A | Discharge status: Home / Self Care | Payer: 59 | Attending: Emergency Medicine | Admitting: Emergency Medicine

## 2018-08-04 DIAGNOSIS — M5441 Lumbago with sciatica, right side: Secondary | ICD-10-CM | POA: Insufficient documentation

## 2018-08-04 DIAGNOSIS — M5432 Sciatica, left side: Secondary | ICD-10-CM

## 2018-08-04 DIAGNOSIS — I10 Essential (primary) hypertension: Secondary | ICD-10-CM | POA: Insufficient documentation

## 2018-08-04 DIAGNOSIS — R202 Paresthesia of skin: Secondary | ICD-10-CM | POA: Diagnosis present

## 2018-08-04 DIAGNOSIS — M5431 Sciatica, right side: Secondary | ICD-10-CM

## 2018-08-04 DIAGNOSIS — R197 Diarrhea, unspecified: Secondary | ICD-10-CM | POA: Insufficient documentation

## 2018-08-04 DIAGNOSIS — M5442 Lumbago with sciatica, left side: Secondary | ICD-10-CM | POA: Insufficient documentation

## 2018-08-04 DIAGNOSIS — Z79899 Other long term (current) drug therapy: Secondary | ICD-10-CM | POA: Diagnosis not present

## 2018-08-04 MED ORDER — CAMPHOR-MENTHOL-METHYL SAL 1.2-5.7-6.3 % EX PTCH: 1.0000 | MEDICATED_PATCH | Freq: Every day | CUTANEOUS | 0 refills | Status: AC

## 2018-08-04 NOTE — ED Triage Notes (Signed)
Pt states that for the past two days she has been having increased numbness of bilateral legs, worse on the right. Pain has went on for months due to fibromyalgia but increased over the past two days making it difficult to walk. Neuro intact bilaterally.

## 2018-08-04 NOTE — ED Provider Notes (Signed)
[facility] EMERGENCY DEPARTMENT Provider Note   CSN: [id number] Arrival date & time: 08/04/18  7829     History   Chief Complaint Chief Complaint  Patient presents with  . Numbness    HPI Flecia Shutter is a 47 y.o. female.  Patient is a 47yo female with PMHx of fibromyalgia, GERD, IBS, and HTN who presents with acute on chronic BLE numbness that has worsened over the last 2 days and has been on going for 3 years.  Patient c/o burning shooting pain that begins at lower back and travels down to feet.  Pain improves when lying down and worsens when ambulating. No fevers, IVDU, incontinence, falls, or other symptoms.  She had an MRI recently for the same that was normal.  Followed by neurology.   Illness  This is a chronic problem. The current episode started 2 days ago. The problem occurs constantly. The problem has been gradually worsening. Pertinent negatives include no chest pain, no abdominal pain and no shortness of breath. The symptoms are aggravated by walking. The symptoms are relieved by lying down and relaxation.    Past Medical History:  Diagnosis Date  . Esophageal reflux   . Hypertension   . IBS (irritable bowel syndrome)   . Migraine   . Numbness   . Pain     Patient Active Problem List   Diagnosis Date Noted  . Right arm pain 12/08/2017  . Pain in limb 03/10/2016  . Paresthesia 03/10/2016  . Abnormality of gait 03/10/2016    Past Surgical History:  Procedure Laterality Date  . ABDOMINAL HYSTERECTOMY    . BTL    . KNEE SURGERY  2007  . TUBAL LIGATION  1999  . TUBAL LIGATION  2014   reversal     OB History   None      Home Medications    Prior to Admission medications   Medication Sig Start Date End Date Taking? Authorizing Provider  amLODipine-benazepril (LOTREL) 10-20 MG capsule Take 1 capsule by mouth daily.  02/11/16  Yes [provider]  Cholecalciferol (VITAMIN D3) 3000 units TABS Take 3,000 Units by mouth  daily.   Yes [provider]  Diclofenac Sodium (PENNSAID) 2 % SOLN Place 2 Pump onto the skin 2 (two) times daily.   Yes [provider]  DULoxetine (CYMBALTA) 60 MG capsule Take 1 capsule (60 mg total) by mouth daily. 12/20/17  Yes Levert Feinstein, MD  estradiol (ESTRACE) 2 MG tablet Take 2 mg by mouth daily.   Yes [provider]  Ibuprofen-Famotidine (DUEXIS) 800-26.6 MG TABS Take 1 tablet by mouth 3 (three) times daily.   Yes [provider]  Morphine-Naltrexone 80-3.2 MG CPCR Take 1 capsule by mouth daily.   Yes [provider]  omeprazole (PRILOSEC) 40 MG capsule Take 40 mg by mouth daily.   Yes [provider]  pregabalin (LYRICA) 150 MG capsule Take 150 mg by mouth 2 (two) times daily.   Yes [provider]  tiZANidine (ZANAFLEX) 4 MG tablet Take 4 mg by mouth at bedtime.   Yes [provider]  Camphor-Menthol-Methyl Sal (HM SALONPAS PAIN RELIEF) 1.2-5.7-6.3 % PTCH Apply 1 patch topically daily for 1 dose. 08/04/18 08/05/18  Abelardo Diesel, MD    Family History Family History  Problem Relation Age of Onset  . Hypertension Father   . Hypertension Mother     Social History Social History   Tobacco Use  . Smoking status: Never Smoker  .  Smokeless tobacco: Never Used  Substance Use Topics  . Alcohol use: Yes    Comment: SOCIAL  . Drug use: No     Allergies   Patient has no known allergies.   Review of Systems Review of Systems  Constitutional: Negative for chills and fever.  HENT: Negative for rhinorrhea and sore throat.   Eyes: Negative for pain and visual disturbance.  Respiratory: Negative for cough and shortness of breath.   Cardiovascular: Negative for chest pain and palpitations.  Gastrointestinal: Positive for diarrhea. Negative for abdominal pain, blood in stool, nausea and vomiting.  Genitourinary: Negative for dysuria and hematuria.  Musculoskeletal: Positive for back pain. Negative for  arthralgias.  Skin: Negative for color change, rash and wound.  Neurological: Positive for numbness (BLE). Negative for seizures and syncope.  All other systems reviewed and are negative.    Physical Exam Updated Vital Signs BP (!) 157/108   Pulse 63   Temp 98.7 F (37.1 C) (Oral)   Resp 16   LMP 09/18/2014   SpO2 100%   Physical Exam  Constitutional: She is oriented to person, place, and time. She appears well-developed and well-nourished. No distress.  HENT:  Head: Normocephalic and atraumatic.  Mouth/Throat: Oropharynx is clear and moist.  Eyes: Pupils are equal, round, and reactive to light. Conjunctivae and EOM are normal.  Neck: Normal range of motion. Neck supple.  Cardiovascular: Normal rate, regular rhythm and intact distal pulses.  No murmur heard. Pulmonary/Chest: Effort normal and breath sounds normal. No respiratory distress.  Abdominal: Soft. There is no tenderness. There is no guarding.  Musculoskeletal: She exhibits no edema.  Neurological: She is alert and oriented to person, place, and time. A sensory deficit (decreased bilaterally R>L) is present. GCS eye subscore is 4. GCS verbal subscore is 5. GCS motor subscore is 6.  Positive SLR bilaterally.  Decreased strength with hip flexion to 4/5 2/2 pain otherwise strength intact. 2+ DP pulses.  Skin: Skin is warm and dry. Capillary refill takes less than 2 seconds. No rash noted.  Psychiatric: She has a normal mood and affect.  Nursing note and vitals reviewed.    ED Treatments / Results  Labs (all labs ordered are listed, but only abnormal results are displayed) Labs Reviewed - No data to display  EKG None  Radiology No results found.  Procedures Procedures (including critical care time)  Medications Ordered in ED Medications - No data to display   Initial Impression / Assessment and Plan / ED Course  I have reviewed the triage vital signs and the nursing notes.  Pertinent labs & imaging  results that were available during my care of the patient were reviewed by me and considered in my medical decision making (see chart for details).      Patient is a 47yo female with PMHx of fibromyalgia, GERD, IBS, and HTN who presents with acute on chronic BLE numbness that has worsened over the last 2 days and has been on going for 3 years.  On arrival patient HDS and well appearing.  Exam as above with numbness to BLE.  Strength decreased 2/2 effort and pain.  No focal neurologic deficits.  She is neurovascularly intact.  Etiology likely sciatica vs chronic back pain.  Per chart review patient with MRI of lumbar spine on 07/22/18 that was normal.  No significant disc bulge, spinal canal, or neuroforaminal stenosis at any level.  Conus and cauda equina normal.    Given recent negative MRI and reassuring exam  as above low concern for cauda equina, epidural abscess, spinal fx, UTI, vascular process or other acute emergent process at this time as this would be inconsistent with history and physical, low risk, other diagnosis much more likely.  No falls, saddle paresthesias, lower bowel/bladder incontinence, UMN signs, IVDU, fevers, or urinary symptoms. Rx given for lidocaine patches.    Old records reviewed.  Imaging and labs reviewed and interpreted by myself and attending and used in the MDM.  Addressed patient question and concerns.  Reviewed discharged instructions with strict precautions given.  Advised patient to schedule follow-up with primary care provider.  Patient verbalized understanding and agrees with plan.  Patient stable at discharge.  The plan for this patient was discussed with Dr. Fredderick Phenix who voiced agreement and who oversaw evaluation and treatment of this patient.  Final Clinical Impressions(s) / ED Diagnoses   Final diagnoses:  Bilateral sciatica    ED Discharge Orders         Ordered    Camphor-Menthol-Methyl Sal (HM SALONPAS PAIN RELIEF) 1.2-5.7-6.3 % PTCH  Daily      08/04/18 0722           Abelardo Diesel, MD 08/05/18 1023    Rolan Bucco, MD 08/05/18 1115

## 2018-08-04 NOTE — Discharge Instructions (Addendum)
Follow up with primary care doctor in 2 days.  Return to the ED for any worsening or other concerns.  Use patches as needed.  Take motrin or tylenol every 6 hours as needed for pain.

## 2018-09-02 ENCOUNTER — Ambulatory Visit
Admission: RE | Admit: 2018-09-02 | Discharge: 2018-09-02 | Disposition: A | Discharge status: Home / Self Care | Payer: 59 | Source: Ambulatory Visit | Attending: Obstetrics & Gynecology | Admitting: Obstetrics & Gynecology

## 2018-09-02 DIAGNOSIS — Z1231 Encounter for screening mammogram for malignant neoplasm of breast: Secondary | ICD-10-CM

## 2018-09-12 ENCOUNTER — Other Ambulatory Visit: Payer: Self-pay

## 2018-09-12 ENCOUNTER — Encounter (HOSPITAL_COMMUNITY): Payer: Self-pay | Admitting: *Deleted

## 2018-09-12 ENCOUNTER — Emergency Department (HOSPITAL_COMMUNITY): Payer: 59

## 2018-09-12 ENCOUNTER — Emergency Department (HOSPITAL_COMMUNITY)
Admission: EM | Admit: 2018-09-12 | Discharge: 2018-09-12 | Disposition: A | Discharge status: Home / Self Care | Payer: 59 | Attending: Emergency Medicine | Admitting: Emergency Medicine

## 2018-09-12 DIAGNOSIS — I10 Essential (primary) hypertension: Secondary | ICD-10-CM | POA: Insufficient documentation

## 2018-09-12 DIAGNOSIS — Z79899 Other long term (current) drug therapy: Secondary | ICD-10-CM | POA: Insufficient documentation

## 2018-09-12 DIAGNOSIS — K5900 Constipation, unspecified: Secondary | ICD-10-CM | POA: Diagnosis not present

## 2018-09-12 LAB — COMPREHENSIVE METABOLIC PANEL
ALT: 9 U/L (ref 0–44)
AST: 30 U/L (ref 15–41)
Albumin: 3.4 g/dL — ABNORMAL LOW (ref 3.5–5.0)
Alkaline Phosphatase: 74 U/L (ref 38–126)
Anion gap: 8 (ref 5–15)
BUN: 12 mg/dL (ref 6–20)
CHLORIDE: 106 mmol/L (ref 98–111)
CO2: 25 mmol/L (ref 22–32)
Calcium: 9.2 mg/dL (ref 8.9–10.3)
Creatinine, Ser: 0.84 mg/dL (ref 0.44–1.00)
Glucose, Bld: 106 mg/dL — ABNORMAL HIGH (ref 70–99)
POTASSIUM: 3.8 mmol/L (ref 3.5–5.1)
SODIUM: 139 mmol/L (ref 135–145)
Total Bilirubin: 1.2 mg/dL (ref 0.3–1.2)
Total Protein: 5.9 g/dL — ABNORMAL LOW (ref 6.5–8.1)

## 2018-09-12 LAB — CBC WITH DIFFERENTIAL/PLATELET
ABS IMMATURE GRANULOCYTES: 0.02 10*3/uL (ref 0.00–0.07)
BASOS ABS: 0.1 10*3/uL (ref 0.0–0.1)
BASOS PCT: 1 %
Eosinophils Absolute: 0.1 10*3/uL (ref 0.0–0.5)
Eosinophils Relative: 1 %
HCT: 40.5 % (ref 36.0–46.0)
Hemoglobin: 13 g/dL (ref 12.0–15.0)
IMMATURE GRANULOCYTES: 0 %
LYMPHS PCT: 32 %
Lymphs Abs: 3 10*3/uL (ref 0.7–4.0)
MCH: 29.7 pg (ref 26.0–34.0)
MCHC: 32.1 g/dL (ref 30.0–36.0)
MCV: 92.7 fL (ref 80.0–100.0)
Monocytes Absolute: 0.3 10*3/uL (ref 0.1–1.0)
Monocytes Relative: 3 %
NEUTROS ABS: 6.1 10*3/uL (ref 1.7–7.7)
NEUTROS PCT: 63 %
PLATELETS: 279 10*3/uL (ref 150–400)
RBC: 4.37 MIL/uL (ref 3.87–5.11)
RDW: 14 % (ref 11.5–15.5)
WBC: 9.6 10*3/uL (ref 4.0–10.5)
nRBC: 0 % (ref 0.0–0.2)

## 2018-09-12 LAB — LIPASE, BLOOD: Lipase: 59 U/L — ABNORMAL HIGH (ref 11–51)

## 2018-09-12 LAB — POC OCCULT BLOOD, ED: FECAL OCCULT BLD: NEGATIVE

## 2018-09-12 MED ORDER — POLYETHYLENE GLYCOL 3350 17 G PO PACK: 17.0000 g | PACK | Freq: Every day | ORAL | 0 refills | Status: DC

## 2018-09-12 NOTE — ED Triage Notes (Signed)
C/o rectal pain and pressure onset 1 month ago, states when she has a BM the pressure increases . Also c/o rectal burning and swelling. Denies rectal bleeding.

## 2018-09-12 NOTE — ED Notes (Signed)
Patient ambulatory to bathroom with steady gait at this time 

## 2018-09-12 NOTE — ED Provider Notes (Signed)
[facility] EMERGENCY DEPARTMENT Provider Note   CSN: [id number] Arrival date & time: 09/12/18  7829     History   Chief Complaint Chief Complaint  Patient presents with  . Rectal Bleeding    HPI Natalie Hunt is a 47 y.o. female with a history of IBS, GERD who presents emergency department today for constipation.  Patient reports that she has had difficulty with constipation over the last several months.  She reports that her last bowel movement was on Saturday and she had it was hard/painful.  She reports when she tries to go she now feels a fullness and a burning sensation in her rectal area.  She reports that she has tried stool softeners for this without any relief.  Patient reports she has intermittent abdominal pain/pressure when she feels she needs to go that is periumbilical in nature but denies any pain currently.  She denies any fever, chills, chest pain, shortness of breath, cough, flank pain, urinary symptoms, nausea/vomiting/diarrhea.  She reports she still passing gas.  She has had a prior abdominal hysterectomy.  No melena or hematochezia.  No other complaints at this time.  Patient denies chronic alcohol use or recent alcohol use.  No chronic NSAID use. She believes her last colonoscopy was normal.   HPI  Past Medical History:  Diagnosis Date  . Esophageal reflux   . Hypertension   . IBS (irritable bowel syndrome)   . Migraine   . Numbness   . Pain     Patient Active Problem List   Diagnosis Date Noted  . Right arm pain 12/08/2017  . Pain in limb 03/10/2016  . Paresthesia 03/10/2016  . Abnormality of gait 03/10/2016    Past Surgical History:  Procedure Laterality Date  . ABDOMINAL HYSTERECTOMY    . ABDOMINAL HYSTERECTOMY    . BREAST BIOPSY Left   . BTL    . KNEE SURGERY  2007  . TUBAL LIGATION  1999  . TUBAL LIGATION  2014   reversal     OB History   None      Home Medications    Prior to Admission medications     Medication Sig Start Date End Date Taking? Authorizing Provider  amLODipine-benazepril (LOTREL) 10-20 MG capsule Take 1 capsule by mouth daily.  02/11/16   [provider]  Cholecalciferol (VITAMIN D3) 3000 units TABS Take 3,000 Units by mouth daily.    [provider]  Diclofenac Sodium (PENNSAID) 2 % SOLN Place 2 Pump onto the skin 2 (two) times daily.    [provider]  DULoxetine (CYMBALTA) 60 MG capsule Take 1 capsule (60 mg total) by mouth daily. 12/20/17   Levert Feinstein, MD  estradiol (ESTRACE) 2 MG tablet Take 2 mg by mouth daily.    [provider]  Ibuprofen-Famotidine (DUEXIS) 800-26.6 MG TABS Take 1 tablet by mouth 3 (three) times daily.    [provider]  Morphine-Naltrexone 80-3.2 MG CPCR Take 1 capsule by mouth daily.    [provider]  omeprazole (PRILOSEC) 40 MG capsule Take 40 mg by mouth daily.    [provider]  pregabalin (LYRICA) 150 MG capsule Take 150 mg by mouth 2 (two) times daily.    [provider]  tiZANidine (ZANAFLEX) 4 MG tablet Take 4 mg by mouth at bedtime.    [provider]    Family History Family History  Problem Relation Age of Onset  . Hypertension Father   . Hypertension Mother  Social History Social History   Tobacco Use  . Smoking status: Never Smoker  . Smokeless tobacco: Never Used  Substance Use Topics  . Alcohol use: Yes    Comment: SOCIAL  . Drug use: No     Allergies   Patient has no known allergies.   Review of Systems Review of Systems  All other systems reviewed and are negative.    Physical Exam Updated Vital Signs BP (!) 156/99 (BP Location: Right Arm)   Pulse 88   Temp 98.5 F (36.9 C) (Oral)   Resp 18   Ht 5\' 1"  (1.549 m)   Wt 65.8 kg   LMP 09/18/2014   SpO2 100%   BMI 27.40 kg/m   Physical Exam  Constitutional: She appears well-developed and well-nourished.  HENT:  Head: Normocephalic and atraumatic.  Right Ear:  External ear normal.  Left Ear: External ear normal.  Nose: Nose normal.  Mouth/Throat: Uvula is midline, oropharynx is clear and moist and mucous membranes are normal. No tonsillar exudate.  Eyes: Pupils are equal, round, and reactive to light. Right eye exhibits no discharge. Left eye exhibits no discharge. No scleral icterus.  Neck: Trachea normal. Neck supple. No spinous process tenderness present. No neck rigidity. Normal range of motion present.  Cardiovascular: Normal rate, regular rhythm and intact distal pulses.  No murmur heard. Pulses:      Radial pulses are 2+ on the right side, and 2+ on the left side.       Dorsalis pedis pulses are 2+ on the right side, and 2+ on the left side.       Posterior tibial pulses are 2+ on the right side, and 2+ on the left side.  No lower extremity swelling or edema. Calves symmetric in size bilaterally.  Pulmonary/Chest: Effort normal and breath sounds normal. She exhibits no tenderness.  Abdominal: Soft. Bowel sounds are normal. She exhibits no distension. There is no tenderness. There is no rigidity, no rebound, no guarding, no CVA tenderness, no tenderness at McBurney's point and negative Murphy's sign.  Genitourinary:  Genitourinary Comments: Chaperone was present.  Patient without pain around the rectal area. Perianal sensory intact. No external fissure's palpated or examined. External hemorrhoids noted without thrombosis. No induration of the skin or swelling. Digital Rectal Exam reveals sphincter with good tone. No masses palpated. Stool color is brown with no overt blood or melena.  Musculoskeletal: She exhibits no edema.  Lymphadenopathy:    She has no cervical adenopathy.  Neurological: She is alert.  Skin: Skin is warm and dry. No rash noted. She is not diaphoretic.  Psychiatric: She has a normal mood and affect.  Nursing note and vitals reviewed.    ED Treatments / Results  Labs (all labs ordered are listed, but only abnormal  results are displayed) Labs Reviewed  COMPREHENSIVE METABOLIC PANEL - Abnormal; Notable for the following components:      Result Value   Glucose, Bld 106 (*)    Total Protein 5.9 (*)    Albumin 3.4 (*)    All other components within normal limits  LIPASE, BLOOD - Abnormal; Notable for the following components:   Lipase 59 (*)    All other components within normal limits  CBC WITH DIFFERENTIAL/PLATELET  URINALYSIS, ROUTINE W REFLEX MICROSCOPIC  POC OCCULT BLOOD, ED    EKG None  Radiology Dg Abdomen 1 View  Result Date: 09/12/2018 CLINICAL DATA:  Two month history of midline abdominal and rectal pain associated with constipation as  well as nausea and fatigue. EXAM: ABDOMEN - 1 VIEW COMPARISON:  Abdominal and pelvic CT scan of July 24, 2016 FINDINGS: The colonic stool burden is increased diffusely. A moderate amount of stool is present in the rectum is well. There was no evidence of a small or large bowel obstruction. There are no abnormal soft tissue calcifications. The bony structures exhibit no acute abnormality. IMPRESSION: Increased colonic and rectal stool burden consistent with constipation in the appropriate clinical setting. Otherwise no acute intra-abdominal abnormality. Electronically Signed   By: David  [place] M.D.   On: 09/12/2018 07:27    Procedures Procedures (including critical care time)  Medications Ordered in ED Medications - No data to display   Initial Impression / Assessment and Plan / ED Course  I have reviewed the triage vital signs and the nursing notes.  Pertinent labs & imaging results that were available during my care of the patient were reviewed by me and considered in my medical decision making (see chart for details).     47 y.o. female with a history of IBS, GERD who presents emergency department today for constipation.  She reports that she is had problems with constipation recently.  She notes that she had a hard stool on Saturday and  since it is benign but ago.  She reports she still passing gas and denies any nausea/vomiting or abdominal pain.  On exam the patient's abdomen is benign.  No peritoneal signs.  No tenderness.  Negative Murphy sign and negative McBurney point.  No CVA tenderness.  She denies any urinary symptoms.  Vital signs are reassuring.  DRE without occult blood. No thrombosed external hemorrhoid or fissure. No fecal impaction.  X-ray is with increased colonic and rectal stool burden consistent with constipation.  Patient reports she has actually been able to have a bowel movement since arrival and is feeling better.  Labs reviewed and reassuring.  Patient does not meet the SIRS or Sepsis criteria.  On repeat exam patient does not have a surgical abdomen and there are no peritoneal signs.  No indication of appendicitis, bowel obstruction, bowel perforation, cholecystitis, diverticulitis. No indication for CT. Will plan for patient to continue stool softeners, add MiraLAX, recommended increasing water intake, adding high-fiber foods. Patient given strict instructions for follow-up with their primary care physician.  I have also discussed reasons to return immediately to the ER.  Patient expresses understanding and agrees with plan. Patient case discussed with Dr. Blinda Leatherwood who is in agreement with plan.  Final Clinical Impressions(s) / ED Diagnoses   Final diagnoses:  Constipation, unspecified constipation type    ED Discharge Orders         Ordered    polyethylene glycol (MIRALAX / GLYCOLAX) packet  Daily     09/12/18 0813           Jacinto Halim, PA-C 09/12/18 0815    Gilda Crease, MD 09/14/18 [phone]

## 2018-09-12 NOTE — Discharge Instructions (Addendum)
Please read and follow all provided instructions  Your diagnoses today includes: constipation  Tests performed today include: Blood work X-ray of your abdomen that shows a large amount of stool in your abdomen Vital signs. See below for your results today.   To help reduce constipation and promote bowel health, 1. Drink at least 64 ounces of water each day (increase water intake); 2. Eat plenty of fiber -i.e. fruits, vegetables, whole grains, legumes (increase fiber intake). If you are not able to eat foods high in fiber, you may use Benefiber or Metamucil over-the-counter. 3. Get plenty of physical activity  If needed, you may also use daily or as needed, MiraLax (Osmotic Laxative) up to  1-2 times a day and Colace (Stool Softener aka Docusate) 100 mg up to twice a day to help with bowel movements. These medications are over the counter.  MiraLax is an Osmotic You may use other over-the-counter medications such as Dulcolax, Fleet enemas, magnesium citrate as needed for constipation. Prune Juice can also be helpful. Please note that some of these medications may cause you to have abdominal cramping which is normal. If you develop severe abdominal pain, fever, vomiting, distention of your abdomen, unable to have a bowel movement for 5 days or are not passing gas, please return to the hospital.  Return instructions:  Please return to the Emergency Department if you experience worsening symptoms.  Please return if you have worsening abdominal pain, swelling of your abdomen, persistent vomiting, blood in your stool or vomit, or fever.  Please return if you have any other emergent concerns. Additional Information:  Your vital signs today were: BP (!) 156/99 (BP Location: Right Arm)    Pulse 88    Temp 98.5 F (36.9 C) (Oral)    Resp 18    Ht 5\' 1"  (1.549 m)    Wt 65.8 kg    LMP 09/18/2014    SpO2 100%    BMI 27.40 kg/m  If your blood pressure (BP) was elevated above 135/85 this visit, please  have this repeated by your doctor within one month. ---------------

## 2018-09-12 NOTE — ED Notes (Signed)
Patient verbalizes understanding of discharge instructions. Opportunity for questioning and answers were provided. Armband removed by staff, pt discharged from ED.  

## 2018-09-12 NOTE — ED Notes (Signed)
Patient returned from xray.

## 2018-09-12 NOTE — ED Notes (Signed)
ED Provider at bedside. 

## 2019-04-12 IMAGING — DX DG CHEST 2V
2 series · 2 of 2 positions shown · non-contrast
Comparison: 01/26/2014

CLINICAL DATA: Cough and congestion

EXAM:
CHEST  2 VIEW

[chest pa]
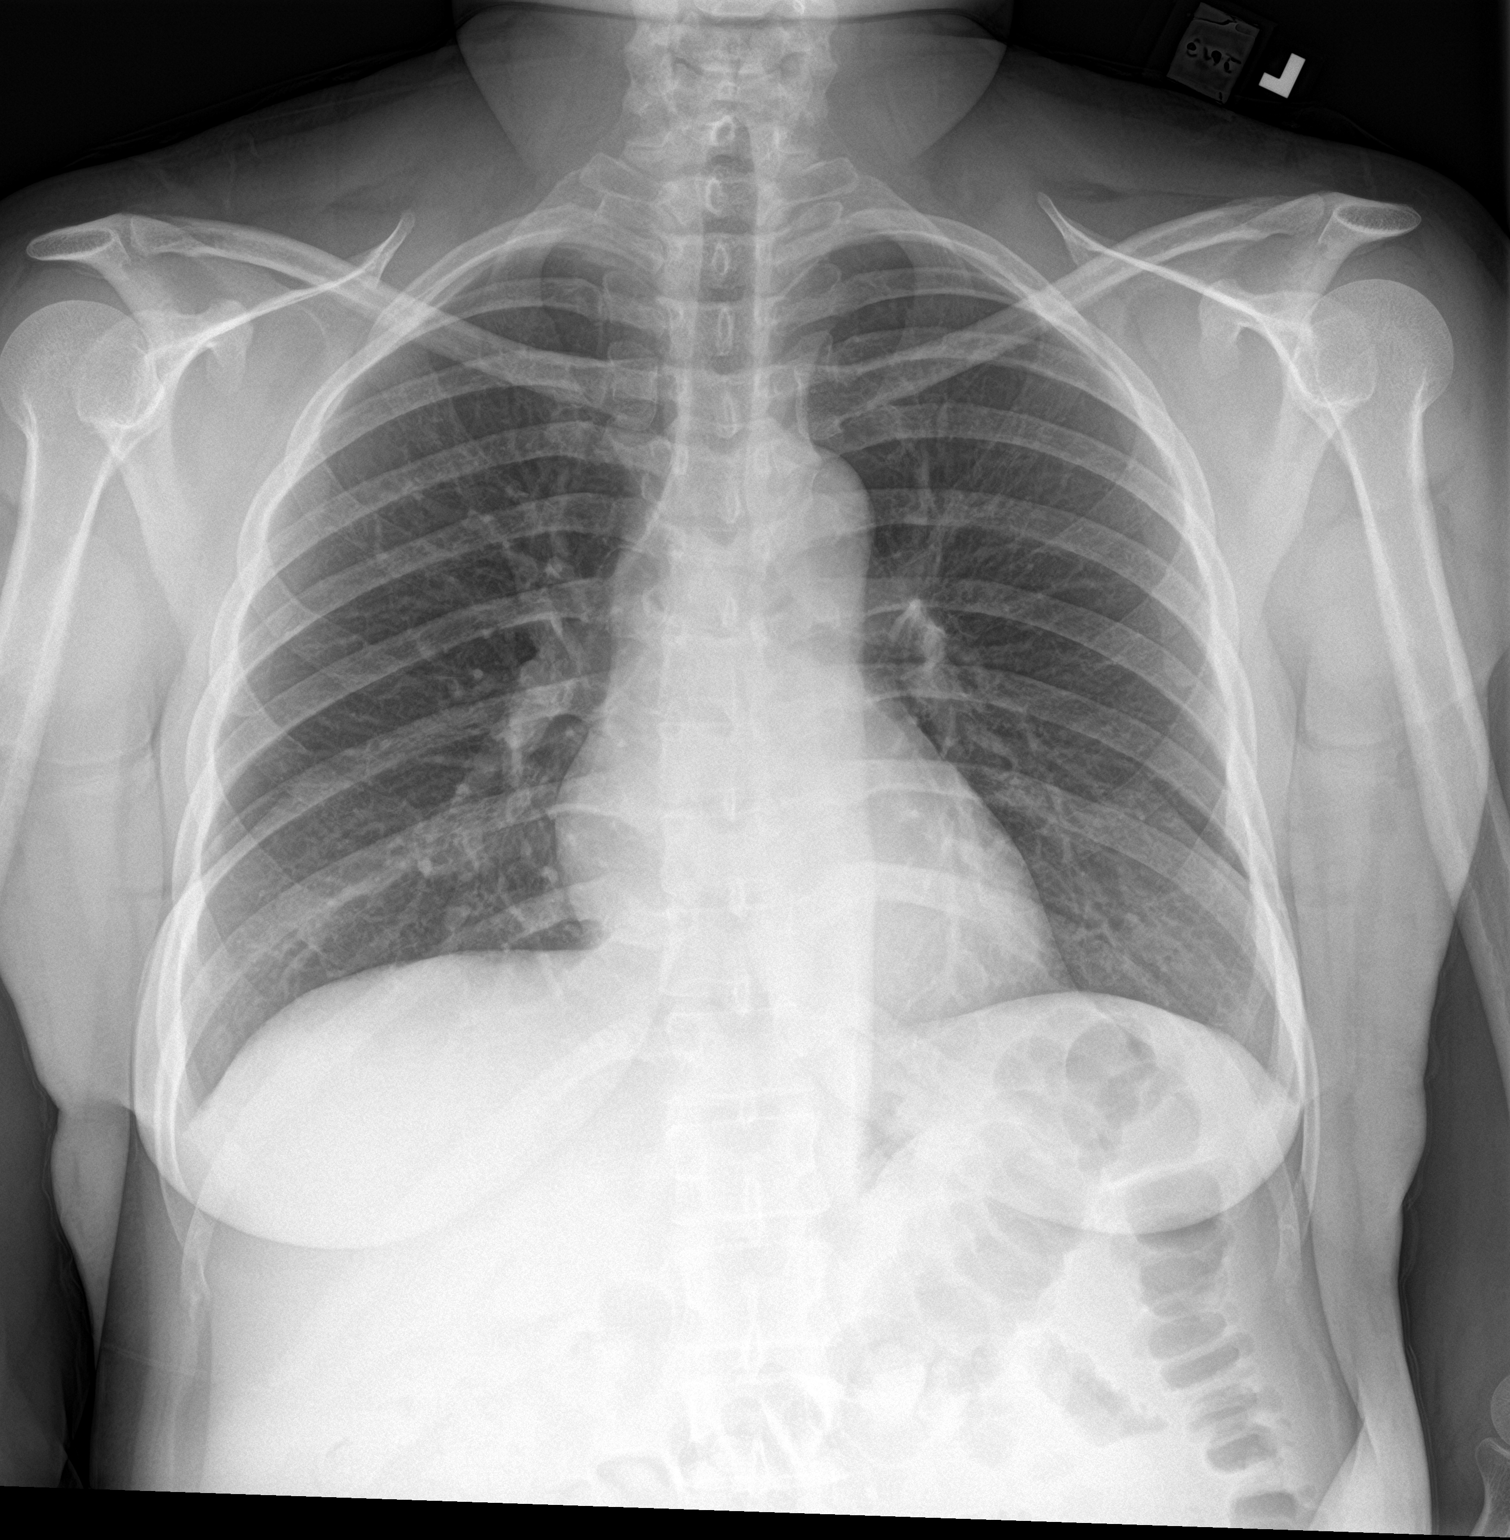

[chest lat]
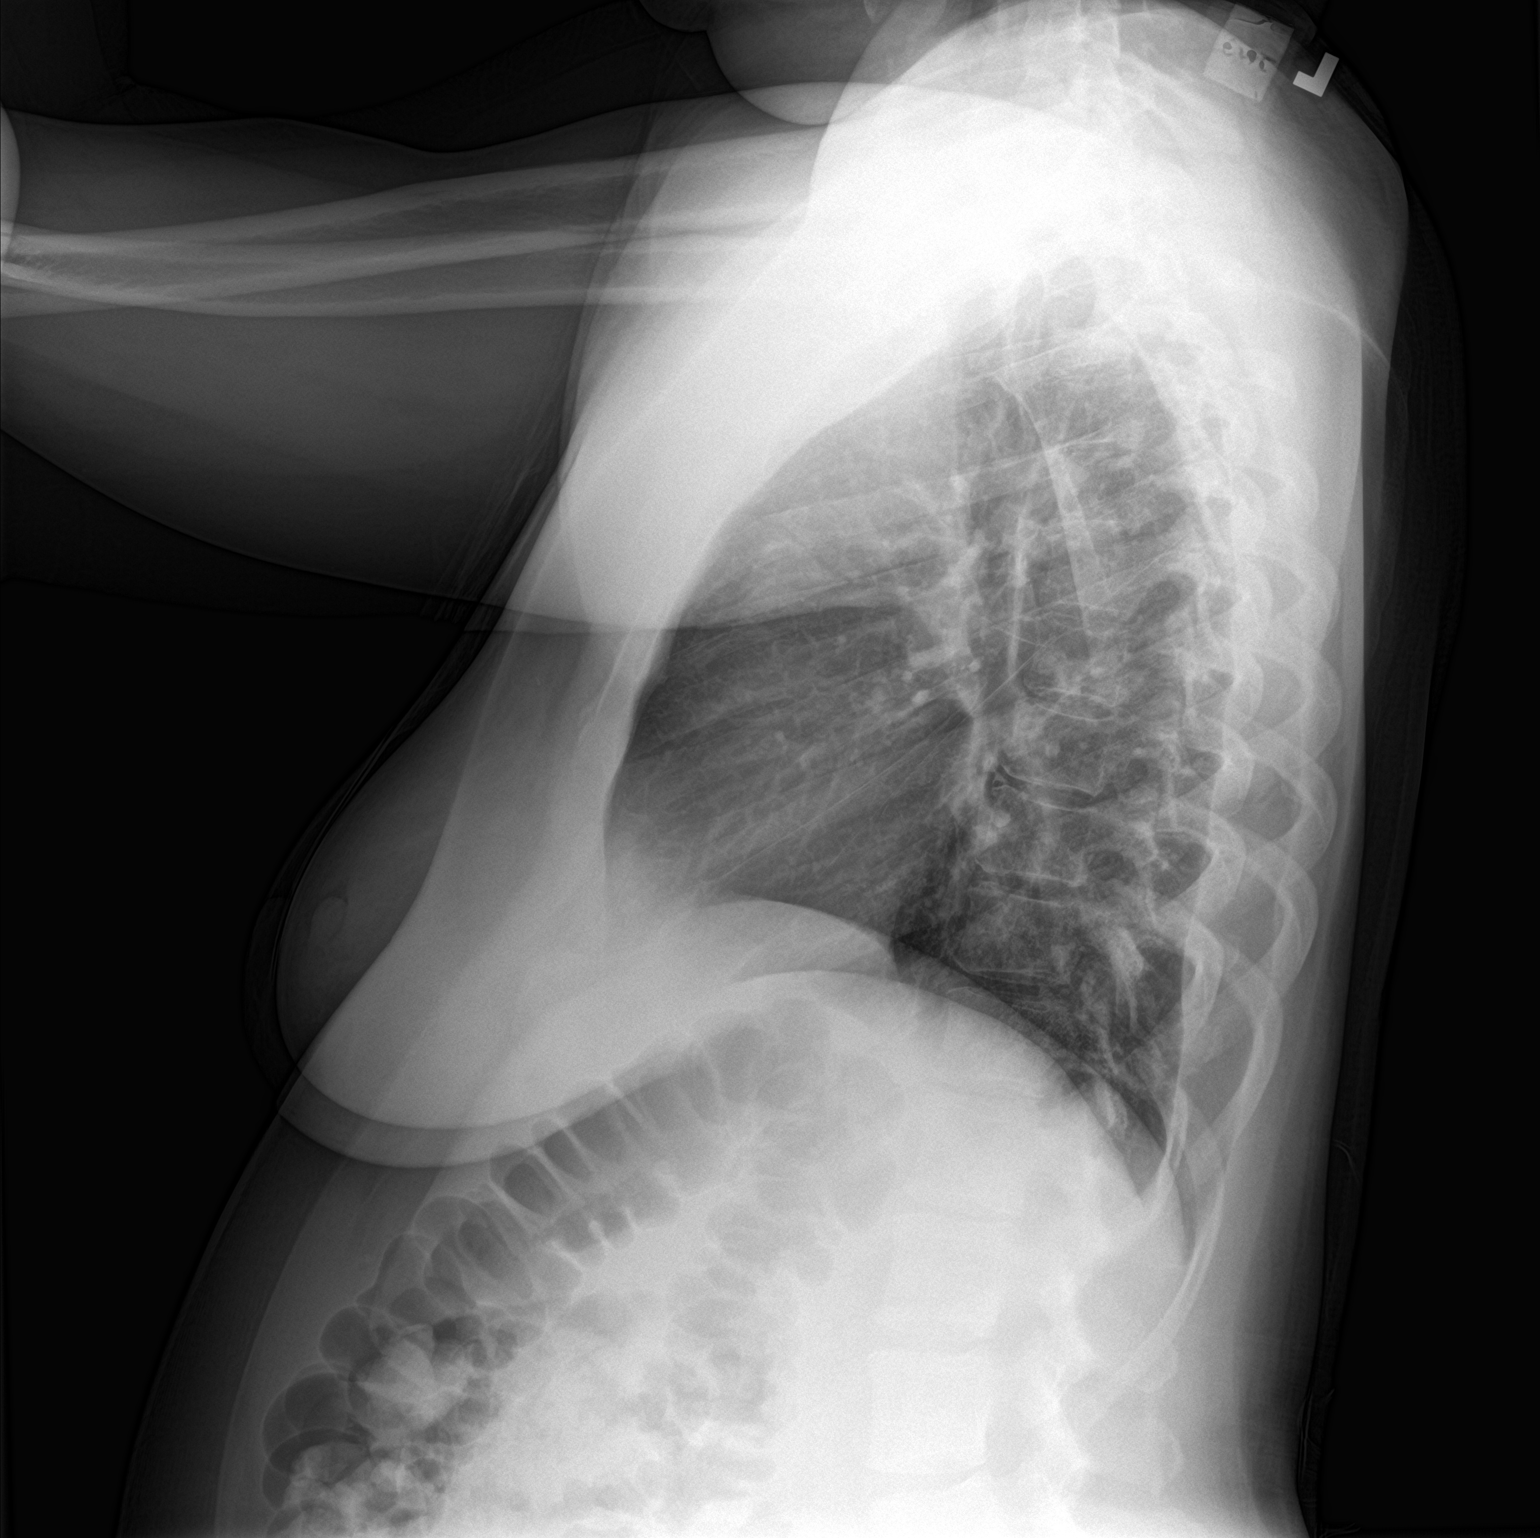

[2 of 2 positions shown; findings below may reference images not displayed]

FINDINGS: The heart size and mediastinal contours are within normal limits.
Both lungs are clear. The visualized skeletal structures are
unremarkable.
IMPRESSION: No active cardiopulmonary disease.

## 2019-08-02 ENCOUNTER — Other Ambulatory Visit: Payer: Self-pay | Admitting: Obstetrics & Gynecology

## 2019-08-02 DIAGNOSIS — Z1231 Encounter for screening mammogram for malignant neoplasm of breast: Secondary | ICD-10-CM

## 2019-08-29 ENCOUNTER — Other Ambulatory Visit: Payer: Self-pay

## 2019-08-29 DIAGNOSIS — Z20822 Contact with and (suspected) exposure to covid-19: Secondary | ICD-10-CM

## 2019-08-31 LAB — NOVEL CORONAVIRUS, NAA: SARS-CoV-2, NAA: NOT DETECTED

## 2019-09-06 ENCOUNTER — Ambulatory Visit: Payer: 59

## 2019-09-19 ENCOUNTER — Other Ambulatory Visit: Payer: Self-pay

## 2019-09-19 ENCOUNTER — Ambulatory Visit
Admission: RE | Admit: 2019-09-19 | Discharge: 2019-09-19 | Disposition: A | Discharge status: Home / Self Care | Payer: BC Managed Care – PPO | Source: Ambulatory Visit | Attending: Obstetrics & Gynecology | Admitting: Obstetrics & Gynecology

## 2019-09-19 DIAGNOSIS — Z1231 Encounter for screening mammogram for malignant neoplasm of breast: Secondary | ICD-10-CM

## 2020-01-09 ENCOUNTER — Other Ambulatory Visit: Payer: Self-pay | Admitting: General Surgery

## 2020-01-09 DIAGNOSIS — R1084 Generalized abdominal pain: Secondary | ICD-10-CM

## 2020-01-17 ENCOUNTER — Encounter (HOSPITAL_COMMUNITY): Payer: Self-pay | Admitting: Emergency Medicine

## 2020-01-17 ENCOUNTER — Emergency Department (HOSPITAL_COMMUNITY)
Admission: EM | Admit: 2020-01-17 | Discharge: 2020-01-17 | Disposition: A | Discharge status: Home / Self Care | Payer: BC Managed Care – PPO | Attending: Emergency Medicine | Admitting: Emergency Medicine

## 2020-01-17 ENCOUNTER — Other Ambulatory Visit: Payer: Self-pay

## 2020-01-17 DIAGNOSIS — Z5321 Procedure and treatment not carried out due to patient leaving prior to being seen by health care provider: Secondary | ICD-10-CM | POA: Insufficient documentation

## 2020-01-17 DIAGNOSIS — R109 Unspecified abdominal pain: Secondary | ICD-10-CM | POA: Insufficient documentation

## 2020-01-17 LAB — CBC
HCT: 41.8 % (ref 36.0–46.0)
Hemoglobin: 13.8 g/dL (ref 12.0–15.0)
MCH: 29.2 pg (ref 26.0–34.0)
MCHC: 33 g/dL (ref 30.0–36.0)
MCV: 88.4 fL (ref 80.0–100.0)
Platelets: 381 10*3/uL (ref 150–400)
RBC: 4.73 MIL/uL (ref 3.87–5.11)
RDW: 13.3 % (ref 11.5–15.5)
WBC: 10.4 10*3/uL (ref 4.0–10.5)
nRBC: 0 % (ref 0.0–0.2)

## 2020-01-17 LAB — URINALYSIS, ROUTINE W REFLEX MICROSCOPIC
Bilirubin Urine: NEGATIVE
Glucose, UA: NEGATIVE mg/dL
Hgb urine dipstick: NEGATIVE
Ketones, ur: NEGATIVE mg/dL
Leukocytes,Ua: NEGATIVE
Nitrite: NEGATIVE
Protein, ur: NEGATIVE mg/dL
Specific Gravity, Urine: 1.013 (ref 1.005–1.030)
pH: 6 (ref 5.0–8.0)

## 2020-01-17 LAB — COMPREHENSIVE METABOLIC PANEL WITH GFR
ALT: 12 U/L (ref 0–44)
AST: 22 U/L (ref 15–41)
Albumin: 4.2 g/dL (ref 3.5–5.0)
Alkaline Phosphatase: 120 U/L (ref 38–126)
Anion gap: 14 (ref 5–15)
BUN: 5 mg/dL — ABNORMAL LOW (ref 6–20)
CO2: 22 mmol/L (ref 22–32)
Calcium: 10.4 mg/dL — ABNORMAL HIGH (ref 8.9–10.3)
Chloride: 99 mmol/L (ref 98–111)
Creatinine, Ser: 0.69 mg/dL (ref 0.44–1.00)
GFR calc Af Amer: >60 mL/min
GFR calc non Af Amer: >60 mL/min
Glucose, Bld: 107 mg/dL — ABNORMAL HIGH (ref 70–99)
Potassium: 3.7 mmol/L (ref 3.5–5.1)
Sodium: 135 mmol/L (ref 135–145)
Total Bilirubin: 0.4 mg/dL (ref 0.3–1.2)
Total Protein: 7.8 g/dL (ref 6.5–8.1)

## 2020-01-17 LAB — I-STAT BETA HCG BLOOD, ED (MC, WL, AP ONLY): I-stat hCG, quantitative: <5 m[IU]/mL (ref <5)

## 2020-01-17 LAB — LIPASE, BLOOD: Lipase: 26 U/L (ref 11–51)

## 2020-01-17 MED ORDER — SODIUM CHLORIDE 0.9% FLUSH: 3.0000 mL | Freq: Once | INTRAVENOUS | Status: DC

## 2020-01-17 NOTE — ED Notes (Signed)
Patient left without being seen.

## 2020-01-17 NOTE — ED Triage Notes (Signed)
Per pt, yesterday she began having generalized abdominal pain.  This morning the pain woke her up out of a sleep and has moved and increased in intensity to the right lower quad and around the the entire lower back.

## 2020-01-19 ENCOUNTER — Ambulatory Visit
Admission: RE | Admit: 2020-01-19 | Discharge: 2020-01-19 | Disposition: A | Discharge status: Home / Self Care | Payer: BC Managed Care – PPO | Source: Ambulatory Visit | Attending: General Surgery | Admitting: General Surgery

## 2020-01-19 DIAGNOSIS — R1084 Generalized abdominal pain: Secondary | ICD-10-CM

## 2020-01-19 MED ORDER — SODIUM CHLORIDE 0.9 % IV SOLN
125.00 | INTRAVENOUS | Status: DC
Start: ? — End: 2020-01-19

## 2020-01-19 MED ORDER — IOPAMIDOL (ISOVUE-300) INJECTION 61%
100.0000 mL | Freq: Once | INTRAVENOUS | Status: AC | PRN
  Administered 2020-01-19: 100 mL via INTRAVENOUS

## 2020-01-26 ENCOUNTER — Ambulatory Visit: Payer: Self-pay | Admitting: General Surgery

## 2020-03-18 ENCOUNTER — Other Ambulatory Visit: Payer: Self-pay

## 2020-03-18 ENCOUNTER — Encounter (HOSPITAL_BASED_OUTPATIENT_CLINIC_OR_DEPARTMENT_OTHER): Payer: Self-pay | Admitting: General Surgery

## 2020-03-18 NOTE — Progress Notes (Signed)
Chart reviewed with Dr. Finucane.  Will proceed with surgery as scheduled without any further clearance/testing needed. 

## 2020-03-19 ENCOUNTER — Other Ambulatory Visit (HOSPITAL_COMMUNITY)
Admission: RE | Admit: 2020-03-19 | Discharge: 2020-03-19 | Disposition: A | Discharge status: Home / Self Care | Payer: BC Managed Care – PPO | Source: Ambulatory Visit | Attending: General Surgery | Admitting: General Surgery

## 2020-03-19 DIAGNOSIS — Z20822 Contact with and (suspected) exposure to covid-19: Secondary | ICD-10-CM | POA: Diagnosis not present

## 2020-03-19 DIAGNOSIS — Z01812 Encounter for preprocedural laboratory examination: Secondary | ICD-10-CM | POA: Diagnosis present

## 2020-03-19 LAB — SARS CORONAVIRUS 2 (TAT 6-24 HRS): SARS Coronavirus 2: NEGATIVE

## 2020-03-19 NOTE — Progress Notes (Signed)

## 2020-03-22 ENCOUNTER — Other Ambulatory Visit: Payer: Self-pay

## 2020-03-22 ENCOUNTER — Ambulatory Visit (HOSPITAL_BASED_OUTPATIENT_CLINIC_OR_DEPARTMENT_OTHER)
Admission: RE | Admit: 2020-03-22 | Discharge: 2020-03-22 | Disposition: A | Discharge status: Home / Self Care | Payer: BC Managed Care – PPO | Source: Ambulatory Visit | Attending: General Surgery | Admitting: General Surgery

## 2020-03-22 ENCOUNTER — Encounter (HOSPITAL_BASED_OUTPATIENT_CLINIC_OR_DEPARTMENT_OTHER)
Admission: RE | Disposition: A | Discharge status: Home / Self Care | Payer: Self-pay | Source: Ambulatory Visit | Attending: General Surgery

## 2020-03-22 ENCOUNTER — Ambulatory Visit (HOSPITAL_BASED_OUTPATIENT_CLINIC_OR_DEPARTMENT_OTHER): Payer: BC Managed Care – PPO | Admitting: Anesthesiology

## 2020-03-22 ENCOUNTER — Encounter (HOSPITAL_BASED_OUTPATIENT_CLINIC_OR_DEPARTMENT_OTHER): Payer: Self-pay | Admitting: General Surgery

## 2020-03-22 DIAGNOSIS — I10 Essential (primary) hypertension: Secondary | ICD-10-CM | POA: Diagnosis not present

## 2020-03-22 DIAGNOSIS — Z8261 Family history of arthritis: Secondary | ICD-10-CM | POA: Diagnosis not present

## 2020-03-22 DIAGNOSIS — F329 Major depressive disorder, single episode, unspecified: Secondary | ICD-10-CM | POA: Insufficient documentation

## 2020-03-22 DIAGNOSIS — K219 Gastro-esophageal reflux disease without esophagitis: Secondary | ICD-10-CM | POA: Diagnosis not present

## 2020-03-22 DIAGNOSIS — Z79899 Other long term (current) drug therapy: Secondary | ICD-10-CM | POA: Diagnosis not present

## 2020-03-22 DIAGNOSIS — Z8249 Family history of ischemic heart disease and other diseases of the circulatory system: Secondary | ICD-10-CM | POA: Diagnosis not present

## 2020-03-22 DIAGNOSIS — F419 Anxiety disorder, unspecified: Secondary | ICD-10-CM | POA: Insufficient documentation

## 2020-03-22 DIAGNOSIS — M1990 Unspecified osteoarthritis, unspecified site: Secondary | ICD-10-CM | POA: Diagnosis not present

## 2020-03-22 DIAGNOSIS — K5900 Constipation, unspecified: Secondary | ICD-10-CM | POA: Diagnosis not present

## 2020-03-22 DIAGNOSIS — K429 Umbilical hernia without obstruction or gangrene: Secondary | ICD-10-CM | POA: Insufficient documentation

## 2020-03-22 HISTORY — PX: UMBILICAL HERNIA REPAIR: SHX196

## 2020-03-22 SURGERY — REPAIR, HERNIA, UMBILICAL, ADULT
Anesthesia: General | Site: Abdomen

## 2020-03-22 MED ORDER — ROCURONIUM BROMIDE 10 MG/ML (PF) SYRINGE
PREFILLED_SYRINGE | INTRAVENOUS | Status: DC | PRN
  Administered 2020-03-22: 50 mg via INTRAVENOUS

## 2020-03-22 MED ORDER — PROPOFOL 10 MG/ML IV BOLUS
INTRAVENOUS | Status: DC | PRN
  Administered 2020-03-22: 150 mg via INTRAVENOUS

## 2020-03-22 MED ORDER — GABAPENTIN 300 MG PO CAPS
300.0000 mg | ORAL_CAPSULE | ORAL | Status: AC
  Administered 2020-03-22: 300 mg via ORAL

## 2020-03-22 MED ORDER — FENTANYL CITRATE (PF) 100 MCG/2ML IJ SOLN: 25.0000 ug | INTRAMUSCULAR | Status: DC | PRN

## 2020-03-22 MED ORDER — MIDAZOLAM HCL 5 MG/5ML IJ SOLN
INTRAMUSCULAR | Status: DC | PRN
  Administered 2020-03-22: 2 mg via INTRAVENOUS

## 2020-03-22 MED ORDER — PROPOFOL 10 MG/ML IV BOLUS
INTRAVENOUS | Status: AC
  Filled 2020-03-22: qty 20

## 2020-03-22 MED ORDER — CHLORHEXIDINE GLUCONATE CLOTH 2 % EX PADS: 6.0000 | MEDICATED_PAD | Freq: Once | CUTANEOUS | Status: DC

## 2020-03-22 MED ORDER — GABAPENTIN 300 MG PO CAPS
ORAL_CAPSULE | ORAL | Status: AC
  Filled 2020-03-22: qty 1

## 2020-03-22 MED ORDER — ONDANSETRON HCL 4 MG/2ML IJ SOLN
INTRAMUSCULAR | Status: AC
  Filled 2020-03-22: qty 2

## 2020-03-22 MED ORDER — CEFAZOLIN SODIUM-DEXTROSE 2-4 GM/100ML-% IV SOLN
2.0000 g | INTRAVENOUS | Status: AC
  Administered 2020-03-22: 2 g via INTRAVENOUS

## 2020-03-22 MED ORDER — DEXAMETHASONE SODIUM PHOSPHATE 10 MG/ML IJ SOLN
INTRAMUSCULAR | Status: DC | PRN
  Administered 2020-03-22: 10 mg via INTRAVENOUS

## 2020-03-22 MED ORDER — MIDAZOLAM HCL 2 MG/2ML IJ SOLN: 1.0000 mg | INTRAMUSCULAR | Status: DC | PRN

## 2020-03-22 MED ORDER — FENTANYL CITRATE (PF) 250 MCG/5ML IJ SOLN
INTRAMUSCULAR | Status: DC | PRN
  Administered 2020-03-22: 50 ug via INTRAVENOUS
  Administered 2020-03-22: 100 ug via INTRAVENOUS
  Administered 2020-03-22: 50 ug via INTRAVENOUS

## 2020-03-22 MED ORDER — CELECOXIB 200 MG PO CAPS
ORAL_CAPSULE | ORAL | Status: AC
  Filled 2020-03-22: qty 1

## 2020-03-22 MED ORDER — HYDROCODONE-ACETAMINOPHEN 5-325 MG PO TABS: 1.0000 | ORAL_TABLET | Freq: Four times a day (QID) | ORAL | 0 refills | Status: DC | PRN

## 2020-03-22 MED ORDER — OXYCODONE HCL 5 MG PO TABS
ORAL_TABLET | ORAL | Status: AC
  Filled 2020-03-22: qty 1

## 2020-03-22 MED ORDER — ACETAMINOPHEN 500 MG PO TABS
ORAL_TABLET | ORAL | Status: AC
  Filled 2020-03-22: qty 2

## 2020-03-22 MED ORDER — OXYCODONE HCL 5 MG PO TABS
5.0000 mg | ORAL_TABLET | Freq: Once | ORAL | Status: AC
  Administered 2020-03-22: 5 mg via ORAL

## 2020-03-22 MED ORDER — MIDAZOLAM HCL 2 MG/2ML IJ SOLN
INTRAMUSCULAR | Status: AC
  Filled 2020-03-22: qty 2

## 2020-03-22 MED ORDER — ONDANSETRON HCL 4 MG/2ML IJ SOLN
INTRAMUSCULAR | Status: DC | PRN
  Administered 2020-03-22: 4 mg via INTRAVENOUS

## 2020-03-22 MED ORDER — BUPIVACAINE-EPINEPHRINE 0.25% -1:200000 IJ SOLN
INTRAMUSCULAR | Status: DC | PRN
  Administered 2020-03-22: 10 mL

## 2020-03-22 MED ORDER — FENTANYL CITRATE (PF) 100 MCG/2ML IJ SOLN: 50.0000 ug | INTRAMUSCULAR | Status: DC | PRN

## 2020-03-22 MED ORDER — CEFAZOLIN SODIUM-DEXTROSE 2-4 GM/100ML-% IV SOLN
INTRAVENOUS | Status: AC
  Filled 2020-03-22: qty 100

## 2020-03-22 MED ORDER — LIDOCAINE 2% (20 MG/ML) 5 ML SYRINGE
INTRAMUSCULAR | Status: DC | PRN
  Administered 2020-03-22: 60 mg via INTRAVENOUS

## 2020-03-22 MED ORDER — LACTATED RINGERS IV SOLN: INTRAVENOUS | Status: DC

## 2020-03-22 MED ORDER — FENTANYL CITRATE (PF) 100 MCG/2ML IJ SOLN
INTRAMUSCULAR | Status: AC
  Filled 2020-03-22: qty 2

## 2020-03-22 MED ORDER — ACETAMINOPHEN 500 MG PO TABS
1000.0000 mg | ORAL_TABLET | ORAL | Status: AC
  Administered 2020-03-22: 1000 mg via ORAL

## 2020-03-22 MED ORDER — DEXAMETHASONE SODIUM PHOSPHATE 10 MG/ML IJ SOLN
INTRAMUSCULAR | Status: AC
  Filled 2020-03-22: qty 1

## 2020-03-22 MED ORDER — CELECOXIB 200 MG PO CAPS
200.0000 mg | ORAL_CAPSULE | ORAL | Status: AC
  Administered 2020-03-22: 200 mg via ORAL

## 2020-03-22 MED ORDER — LIDOCAINE 2% (20 MG/ML) 5 ML SYRINGE
INTRAMUSCULAR | Status: AC
  Filled 2020-03-22: qty 5

## 2020-03-22 MED ORDER — PROMETHAZINE HCL 25 MG/ML IJ SOLN: 6.2500 mg | INTRAMUSCULAR | Status: DC | PRN

## 2020-03-22 SURGICAL SUPPLY — 50 items
ADH SKN CLS APL DERMABOND .7 (GAUZE/BANDAGES/DRESSINGS) ×1
APL PRP STRL LF DISP 70% ISPRP (MISCELLANEOUS) ×1
APL SKNCLS STERI-STRIP NONHPOA (GAUZE/BANDAGES/DRESSINGS)
BENZOIN TINCTURE PRP APPL 2/3 (GAUZE/BANDAGES/DRESSINGS) IMPLANT
BLADE CLIPPER SURG (BLADE) IMPLANT
BLADE SURG 15 STRL LF DISP TIS (BLADE) ×1 IMPLANT
BLADE SURG 15 STRL SS (BLADE) ×3
CHLORAPREP W/TINT 26 (MISCELLANEOUS) ×3 IMPLANT
CLOSURE WOUND 1/2 X4 (GAUZE/BANDAGES/DRESSINGS)
COVER BACK TABLE 60X90IN (DRAPES) ×3 IMPLANT
COVER MAYO STAND STRL (DRAPES) ×3 IMPLANT
COVER WAND RF STERILE (DRAPES) IMPLANT
DECANTER SPIKE VIAL GLASS SM (MISCELLANEOUS) ×3 IMPLANT
DERMABOND ADVANCED (GAUZE/BANDAGES/DRESSINGS) ×2
DERMABOND ADVANCED .7 DNX12 (GAUZE/BANDAGES/DRESSINGS) ×1 IMPLANT
DRAPE LAPAROTOMY TRNSV 102X78 (DRAPES) ×3 IMPLANT
DRAPE UTILITY XL STRL (DRAPES) ×3 IMPLANT
DRSG TEGADERM 4X4.75 (GAUZE/BANDAGES/DRESSINGS) IMPLANT
ELECT COATED BLADE 2.86 ST (ELECTRODE) ×3 IMPLANT
ELECT REM PT RETURN 9FT ADLT (ELECTROSURGICAL) ×3
ELECTRODE REM PT RTRN 9FT ADLT (ELECTROSURGICAL) ×1 IMPLANT
GAUZE SPONGE 4X4 12PLY STRL LF (GAUZE/BANDAGES/DRESSINGS) ×2 IMPLANT
GLOVE BIO SURGEON STRL SZ 6.5 (GLOVE) ×1 IMPLANT
GLOVE BIO SURGEON STRL SZ7.5 (GLOVE) ×3 IMPLANT
GLOVE BIO SURGEONS STRL SZ 6.5 (GLOVE) ×1
GLOVE BIOGEL PI IND STRL 6.5 (GLOVE) IMPLANT
GLOVE BIOGEL PI INDICATOR 6.5 (GLOVE) ×2
GOWN STRL REUS W/ TWL LRG LVL3 (GOWN DISPOSABLE) ×2 IMPLANT
GOWN STRL REUS W/TWL LRG LVL3 (GOWN DISPOSABLE) ×6
NDL HYPO 25X1 1.5 SAFETY (NEEDLE) ×1 IMPLANT
NEEDLE HYPO 22GX1.5 SAFETY (NEEDLE) IMPLANT
NEEDLE HYPO 25X1 1.5 SAFETY (NEEDLE) ×3 IMPLANT
NS IRRIG 1000ML POUR BTL (IV SOLUTION) IMPLANT
PENCIL SMOKE EVACUATOR (MISCELLANEOUS) ×3 IMPLANT
SET BASIN DAY SURGERY F.S. (CUSTOM PROCEDURE TRAY) ×3 IMPLANT
SLEEVE SCD COMPRESS KNEE MED (MISCELLANEOUS) ×2 IMPLANT
SPONGE LAP 18X18 RF (DISPOSABLE) ×3 IMPLANT
STRIP CLOSURE SKIN 1/2X4 (GAUZE/BANDAGES/DRESSINGS) IMPLANT
SUT MON AB 4-0 PC3 18 (SUTURE) ×3 IMPLANT
SUT NOVA NAB DX-16 0-1 5-0 T12 (SUTURE) ×2 IMPLANT
SUT NOVA NAB GS-21 1 T12 (SUTURE) ×3 IMPLANT
SUT SILK 3 0 SH 30 (SUTURE) IMPLANT
SUT VIC AB 2-0 SH 27 (SUTURE) ×6
SUT VIC AB 2-0 SH 27XBRD (SUTURE) ×1 IMPLANT
SUT VIC AB 3-0 54X BRD REEL (SUTURE) IMPLANT
SUT VIC AB 3-0 BRD 54 (SUTURE)
SUT VIC AB 3-0 SH 27 (SUTURE)
SUT VIC AB 3-0 SH 27X BRD (SUTURE) IMPLANT
SYR CONTROL 10ML LL (SYRINGE) ×3 IMPLANT
TOWEL GREEN STERILE FF (TOWEL DISPOSABLE) ×6 IMPLANT

## 2020-03-22 NOTE — Transfer of Care (Signed)
Immediate Anesthesia Transfer of Care Note  Patient: Natalie Hunt  Procedure(s) Performed: UMBILICAL HERNIA REPAIR WITH MESH (N/A Abdomen)  Patient Location: PACU  Anesthesia Type:General  Level of Consciousness: awake, alert , oriented and patient cooperative  Airway & Oxygen Therapy: Patient Spontanous Breathing, Patient connected to nasal cannula oxygen and Patient connected to face mask  Post-op Assessment: Report given to RN, Post -op Vital signs reviewed and stable and Patient moving all extremities  Post vital signs: Reviewed and stable  Last Vitals:  Vitals Value Taken Time  BP 131/92 03/22/20 1045  Temp    Pulse 85 03/22/20 1046  Resp 18 03/22/20 1046  SpO2 100 % 03/22/20 1046  Vitals shown include unvalidated device data.  Last Pain:  Vitals:   03/22/20 0811  TempSrc: Oral  PainSc: 6          Complications: No apparent anesthesia complications

## 2020-03-22 NOTE — Anesthesia Procedure Notes (Signed)
Procedure Name: Intubation Date/Time: 03/22/2020 9:55 AM Performed by: Myna Bright, CRNA Pre-anesthesia Checklist: Patient identified, Emergency Drugs available, Suction available and Patient being monitored Patient Re-evaluated:Patient Re-evaluated prior to induction Oxygen Delivery Method: Circle system utilized Preoxygenation: Pre-oxygenation with 100% oxygen Induction Type: IV induction Ventilation: Mask ventilation without difficulty Laryngoscope Size: Mac and 3 Grade View: Grade I Tube type: Oral Tube size: 7.0 mm Number of attempts: 1 Airway Equipment and Method: Stylet Placement Confirmation: ETT inserted through vocal cords under direct vision,  positive ETCO2 and breath sounds checked- equal and bilateral Secured at: 21 cm Tube secured with: Tape Dental Injury: Teeth and Oropharynx as per pre-operative assessment

## 2020-03-22 NOTE — Discharge Instructions (Signed)
NO TYLENOL PRODUCTS UNTIL 2:15 PM   NO ADVIL / MOTRIN UNTIL 4:15 PM IF OK WITH DR TOTH   PAIN MEDICINE OXYCODONE  WAS GIVEN AT 11:20. NO PAIN MEDICINE UNTIL 5:20PM  Post Anesthesia Home Care Instructions  Activity: Get plenty of rest for the remainder of the day. A responsible individual must stay with you for 24 hours following the procedure.  For the next 24 hours, DO NOT: -Drive a car -Advertising copywriter -Drink alcoholic beverages -Take any medication unless instructed by your physician -Make any legal decisions or sign important papers.  Meals: Start with liquid foods such as gelatin or soup. Progress to regular foods as tolerated. Avoid greasy, spicy, heavy foods. If nausea and/or vomiting occur, drink only clear liquids until the nausea and/or vomiting subsides. Call your physician if vomiting continues.  Special Instructions/Symptoms: Your throat may feel dry or sore from the anesthesia or the breathing tube placed in your throat during surgery. If this causes discomfort, gargle with warm salt water. The discomfort should disappear within 24 hours.  If you had a scopolamine patch placed behind your ear for the management of post- operative nausea and/or vomiting:  1. The medication in the patch is effective for 72 hours, after which it should be removed.  Wrap patch in a tissue and discard in the trash. Wash hands thoroughly with soap and water. 2. You may remove the patch earlier than 72 hours if you experience unpleasant side effects which may include dry mouth, dizziness or visual disturbances. 3. Avoid touching the patch. Wash your hands with soap and water after contact with the patch.

## 2020-03-22 NOTE — H&P (Signed)
Natalie Hunt  Location: [facility] Surgery Patient #: [id number] DOB: [birth year] Married / Language: English / Race: Black or African American Female   History of Present Illness  The patient is a 49 year old female who presents for a follow-up for Abdominal pain. We are asked to see the patient in consultation by Dr. Carol Ada to evaluate her for a possible ventral hernia. The patient is a 49 year old black female who has been experiencing generalized abdominal pain several months. She states that the pain is fairly constant. It seems to be worse when she eats foods. She has nausea and vomiting about 2-3 times per week. She has not lost any unintentional weight. She does not smoke. She has had a recent colonoscopy and reports significant problems with constipation.   Past Surgical History Hysterectomy (not due to cancer) - Complete  Knee Surgery  Left. Oral Surgery   Diagnostic Studies History  Colonoscopy  1-5 years ago never Mammogram  within last year Pap Smear  1-5 years ago  Allergies  No Known Drug Allergies   Medication History  Pregabalin (150MG  Capsule, Oral) Active. amLODIPine Besy-Benazepril HCl (10-20MG  Capsule, Oral) Active. DULoxetine HCl (60MG  Capsule DR Part, Oral) Active. Estradiol (2MG  Tablet, Oral) Active. Omeprazole (40MG  Capsule DR, Oral) Active. tiZANidine HCl (4MG  Tablet, Oral) Active. Venlafaxine HCl ER (150MG  Capsule ER 24HR, Oral) Active. Diclofenac Sodium (1% Gel, Transdermal) Active. traZODone HCl (Oral) Specific strength unknown - Active. QUEtiapine Fumarate (50MG  Tablet, Oral) Active. Vitamin D-3 (Oral) Specific strength unknown - Active. Medications Reconciled  Social History Alcohol use  Moderate alcohol use. Caffeine use  Carbonated beverages, Coffee, Tea. No drug use  Tobacco use  Never smoker.  Family History  Arthritis  Father, Mother. Cerebrovascular Accident  Mother. Colon Cancer   Family Members In General, Father. Depression  Sister. Hypertension  Father, Mother. Migraine Headache  Brother, Mother, Daughter, Son, Family Members In [place], Sister. Respiratory Condition  Mother.  Pregnancy / Birth History Age at menarche  64 years, 85 years. Age of menopause  <47 Gravida  4 Irregular periods  Maternal age  28-20 Para  2  Other Problems  Anxiety Disorder  Arthritis  Back Pain  Depression  Gastroesophageal Reflux Disease  Hemorrhoids  High blood pressure  Hypercholesterolemia  Migraine Headache  Oophorectomy     Review of Systems General Present- Fatigue and Night Sweats. Not Present- Appetite Loss, Chills, Fever, Weight Gain and Weight Loss. Skin Not Present- Change in Wart/Mole, Dryness, Hives, Jaundice, New Lesions, Non-Healing Wounds, Rash and Ulcer. HEENT Present- Hoarseness, Seasonal Allergies and Wears glasses/contact lenses. Not Present- Earache, Hearing Loss, Nose Bleed, Oral Ulcers, Ringing in the Ears, Sinus Pain, Sore Throat, Visual Disturbances and Yellow Eyes. Respiratory Not Present- Bloody sputum, Chronic Cough, Difficulty Breathing, Snoring and Wheezing. Breast Not Present- Breast Mass, Breast Pain, Nipple Discharge and Skin Changes. Cardiovascular Present- Leg Cramps and Swelling of Extremities. Not Present- Chest Pain, Difficulty Breathing Lying Down, Palpitations, Rapid Heart Rate and Shortness of Breath. Gastrointestinal Present- Abdominal Pain, Bloating, Change in Bowel Habits, Chronic diarrhea, Constipation, Difficulty Swallowing, Excessive gas, Gets full quickly at meals, Hemorrhoids, Indigestion and Nausea. Not Present- Bloody Stool, Rectal Pain and Vomiting. Female Genitourinary Present- Frequency. Not Present- Nocturia, Painful Urination, Pelvic Pain and Urgency. Musculoskeletal Present- Back Pain, Joint Pain and Joint Stiffness. Not Present- Muscle Pain, Muscle Weakness and Swelling of  Extremities. Neurological Present- Headaches, Numbness, Tingling and Weakness. Not Present- Decreased Memory, Fainting, Seizures, Tremor and Trouble walking. Psychiatric Present- Anxiety, Bipolar  and Depression. Not Present- Change in Sleep Pattern, Fearful and Frequent crying. Endocrine Present- Cold Intolerance and Heat Intolerance. Not Present- Excessive Hunger, Hair Changes, Hot flashes and New Diabetes. Hematology Not Present- Blood Thinners, Easy Bruising, Excessive bleeding, Gland problems, HIV and Persistent Infections.  Vitals  Weight: 155.5 lb Height: 61in Body Surface Area: 1.7 m Body Mass Index: 29.38 kg/m  Temp.: 79F  Pulse: 99 (Regular)  BP: 124/72(Sitting, Left Arm, Standard)       Physical Exam General Mental Status-Alert. General Appearance-Consistent with stated age. Hydration-Well hydrated. Voice-Normal.  Head and Neck Head-normocephalic, atraumatic with no lesions or palpable masses. Trachea-midline. Thyroid Gland Characteristics - normal size and consistency.  Eye Eyeball - Bilateral-Extraocular movements intact. Sclera/Conjunctiva - Bilateral-No scleral icterus.  Chest and Lung Exam Chest and lung exam reveals -quiet, even and easy respiratory effort with no use of accessory muscles and on auscultation, normal breath sounds, no adventitious sounds and normal vocal resonance. Inspection Chest Wall - Normal. Back - normal.  Cardiovascular Cardiovascular examination reveals -normal heart sounds, regular rate and rhythm with no murmurs and normal pedal pulses bilaterally.  Abdomen Note: The abdomen is soft. There is mild to moderate diffuse tenderness that seems to be a little worse along her upper midline. There is no palpable bulge or fascial defect. She does have a well-healed infraumbilical laparoscopy scar   Neurologic Neurologic evaluation reveals -alert and oriented x 3 with no impairment of recent or remote  memory. Mental Status-Normal.  Musculoskeletal Normal Exam - Left-Upper Extremity Strength Normal and Lower Extremity Strength Normal. Normal Exam - Right-Upper Extremity Strength Normal and Lower Extremity Strength Normal.  Lymphatic Head & Neck  General Head & Neck Lymphatics: Bilateral - Description - Normal. Axillary  General Axillary Region: Bilateral - Description - Normal. Tenderness - Non Tender. Femoral & Inguinal  Generalized Femoral & Inguinal Lymphatics: Bilateral - Description - Normal. Tenderness - Non Tender.    Assessment & Plan  ABDOMINAL PAIN, DIFFUSE (R10.84) Impression: The patient has been experiencing generalized abdominal pain. Clinically I cannot palpate a hernia today. She does seem to have most of her tenderness along her midline. At this point I would recommend evaluating her abdomen with a CT scan of the abdomen and pelvis to look for a source of the pain. We will call her with the results of the study. If she does have a hernia and then hopefully we can help her pain by fixing it. If she does not then we may have to defer to the medical doctors for medical management. This patient encounter took 45 minutes today to perform the following: take history, perform exam, review outside records, interpret imaging, counsel the patient on their diagnosis and document encounter, findings & plan in the EHR Current Plans CT ABDOMEN AND PELVIS W CONTRAST (74177)(ACR 4 )(DSN [id number])(G-Code G1004(ME)) (Clinical Indications: Abdominal pain, hernia suspected)  CT shows a small umbilical hernia.  Because of the risk of incarceration and strangulation I think she would benefit from having this fixed.  She would also like to have this done.  I have discussed with her in detail the risks and benefits of the operation as well as some of the technical aspects including the use of rash and the risk of chronic pain and she understands and wishes to proceed

## 2020-03-22 NOTE — Op Note (Signed)
03/22/2020  10:35 AM  PATIENT:  Natalie Hunt  49 y.o. female  PRE-OPERATIVE DIAGNOSIS:  UMBILICAL HERNIA  POST-OPERATIVE DIAGNOSIS:  UMBILICAL HERNIA  PROCEDURE:  Procedure(s): UMBILICAL HERNIA PRIMARY REPAIR  SURGEON:  Surgeon(s) and Role:    * Griselda Miner, MD - Primary  PHYSICIAN ASSISTANT:   ASSISTANTS: none   ANESTHESIA:   local and general  EBL:  minimal   BLOOD ADMINISTERED:none  DRAINS: none   LOCAL MEDICATIONS USED:  MARCAINE     SPECIMEN:  No Specimen  DISPOSITION OF SPECIMEN:  N/A  COUNTS:  YES  TOURNIQUET:  * No tourniquets in log *  DICTATION: .Dragon Dictation   After informed consent was obtained the patient was brought to the operating room and placed in the supine position on the operating table.  After adequate induction of general anesthesia the patient's abdomen was prepped with ChloraPrep, allowed to dry, and draped in usual sterile manner.  An appropriate timeout was performed.  The area around the umbilicus was infiltrated with quarter percent Marcaine.  A small transversely oriented incision was made at the upper edge of the umbilicus with a 15 blade knife.  The incision was carried through the skin and subcutaneous tissue sharply with electrocautery until the fascia of the abdominal wall was encountered.  At the base of the bellybutton there is seem to be a small defect.  The defect was opened sharply with the electrocautery.  There was minimal preperitoneal fat within the defect.  This was able to be easily reduced.  The abdominal wall around the fascial defect was probed with a hemostat and no other openings were appreciated.  The defect was too small to get a finger through the defect and because of this I elected to not place a piece of mesh under the fascia.  The fascial edges were nice and thick and healthy.  The defect was then closed with interrupted #1 Novafil stitches.  The wound was irrigated with copious amounts of saline and  infiltrated with more quarter percent Marcaine.  The umbilicus was then tacked to the fascia with 2-0 Vicryl stitch.  The subcutaneous tissue was also closed with interrupted 2-0 Vicryl stitches.  The skin was then closed with a running 4-0 Monocryl subcuticular stitch.  Dermabond dressings were applied.  The patient tolerated the procedure well.  At the end of the case all needle sponge and instrument counts were correct.  The patient was then awakened and taken to recovery in stable condition.  PLAN OF CARE: Discharge to home after PACU  PATIENT DISPOSITION:  PACU - hemodynamically stable.   Delay start of Pharmacological VTE agent (>24hrs) due to surgical blood loss or risk of bleeding: not applicable

## 2020-03-22 NOTE — Anesthesia Preprocedure Evaluation (Addendum)
Anesthesia Evaluation  Patient identified by MRN, date of birth, ID band Patient awake    Reviewed: Allergy & Precautions, NPO status , Patient's Chart, lab work & pertinent test results  History of Anesthesia Complications Negative for: history of anesthetic complications  Airway Mallampati: III  TM Distance: >3 FB Neck ROM: Full    Dental  (+) Teeth Intact, Dental Advisory Given   Pulmonary neg pulmonary ROS,    Pulmonary exam normal breath sounds clear to auscultation       Cardiovascular hypertension, Pt. on medications Normal cardiovascular exam Rhythm:Regular Rate:Normal     Neuro/Psych  Headaches, negative psych ROS   GI/Hepatic Neg liver ROS, GERD  Medicated and Controlled,UMBILICAL HERNIA   Endo/Other  negative endocrine ROS  Renal/GU negative Renal ROS     Musculoskeletal negative musculoskeletal ROS (+)   Abdominal   Peds  Hematology negative hematology ROS (+)   Anesthesia Other Findings Day of surgery medications reviewed with the patient.  Reproductive/Obstetrics negative OB ROS                            Anesthesia Physical Anesthesia Plan  ASA: II  Anesthesia Plan: General   Post-op Pain Management:    Induction: Intravenous  PONV Risk Score and Plan: 3 and Midazolam, Dexamethasone and Ondansetron  Airway Management Planned: Oral ETT  Additional Equipment:   Intra-op Plan:   Post-operative Plan: Extubation in OR  Informed Consent: I have reviewed the patients History and Physical, chart, labs and discussed the procedure including the risks, benefits and alternatives for the proposed anesthesia with the patient or authorized representative who has indicated his/her understanding and acceptance.     Dental advisory given  Plan Discussed with: CRNA  Anesthesia Plan Comments:        Anesthesia Quick Evaluation

## 2020-03-23 NOTE — Anesthesia Postprocedure Evaluation (Signed)
Anesthesia Post Note  Patient: Natalie Hunt  Procedure(s) Performed: UMBILICAL HERNIA REPAIR WITH MESH (N/A Abdomen)     Patient location during evaluation: PACU Anesthesia Type: General Level of consciousness: awake and alert Pain management: pain level controlled Vital Signs Assessment: post-procedure vital signs reviewed and stable Respiratory status: spontaneous breathing, nonlabored ventilation and respiratory function stable Cardiovascular status: blood pressure returned to baseline and stable Postop Assessment: no apparent nausea or vomiting Anesthetic complications: no    Last Vitals:  Vitals:   03/22/20 1119 03/22/20 1155  BP:  (!) 113/93  Pulse: 78 63  Resp: 13 16  Temp:  36.5 C  SpO2: 100% 100%    Last Pain:  Vitals:   03/22/20 1155  TempSrc: Oral  PainSc: 3                  Cecile Hearing

## 2020-03-25 ENCOUNTER — Encounter: Payer: Self-pay | Admitting: *Deleted

## 2020-08-10 ENCOUNTER — Other Ambulatory Visit: Payer: Self-pay

## 2020-08-10 ENCOUNTER — Encounter: Payer: Self-pay | Admitting: Emergency Medicine

## 2020-08-10 ENCOUNTER — Ambulatory Visit
Admission: EM | Admit: 2020-08-10 | Discharge: 2020-08-10 | Disposition: A | Discharge status: Home / Self Care | Payer: BC Managed Care – PPO | Attending: Emergency Medicine | Admitting: Emergency Medicine

## 2020-08-10 DIAGNOSIS — Z20822 Contact with and (suspected) exposure to covid-19: Secondary | ICD-10-CM

## 2020-08-10 DIAGNOSIS — J069 Acute upper respiratory infection, unspecified: Secondary | ICD-10-CM | POA: Diagnosis not present

## 2020-08-10 DIAGNOSIS — Z1152 Encounter for screening for COVID-19: Secondary | ICD-10-CM

## 2020-08-10 MED ORDER — BENZONATATE 100 MG PO CAPS: 100.0000 mg | ORAL_CAPSULE | Freq: Three times a day (TID) | ORAL | 0 refills | Status: DC

## 2020-08-10 MED ORDER — CETIRIZINE HCL 10 MG PO TABS: 10.0000 mg | ORAL_TABLET | Freq: Every day | ORAL | 0 refills | Status: DC

## 2020-08-10 MED ORDER — ALBUTEROL SULFATE HFA 108 (90 BASE) MCG/ACT IN AERS
2.0000 | INHALATION_SPRAY | Freq: Four times a day (QID) | RESPIRATORY_TRACT | 2 refills | Status: DC | PRN
Start: 2020-08-10 — End: 2023-03-22

## 2020-08-10 MED ORDER — FLUTICASONE PROPIONATE 50 MCG/ACT NA SUSP
1.0000 | Freq: Every day | NASAL | 0 refills | Status: DC
Start: 2020-08-10 — End: 2023-03-22

## 2020-08-10 MED ORDER — AEROCHAMBER PLUS FLO-VU MEDIUM MISC: 1.0000 | Freq: Once | 0 refills | Status: AC

## 2020-08-10 MED ORDER — PREDNISONE 50 MG PO TABS: 50.0000 mg | ORAL_TABLET | Freq: Every day | ORAL | 0 refills | Status: DC

## 2020-08-10 NOTE — ED Triage Notes (Signed)
Pt sts cough with congestion and pain with cough; pt sts facial pain and pressure with congestion and fullness in ears x 5 days

## 2020-08-10 NOTE — ED Provider Notes (Signed)
[facility]    CSN: [id number] Arrival date & time: 08/10/20  1123      History   Chief Complaint Chief Complaint  Patient presents with  . Cough    HPI Natalie Hunt is a 49 y.o. female  Presenting for Covid testing.  Patient nursing 5-day course of nonproductive cough, nasal congestion, pain with cough.  States that symptom onset was sudden.  Also endorsing bilateral ear fullness without change in hearing, tinnitus, discharge.  No fever, shortness of breath, palpitations, vomiting or diarrhea.  Is fully Covid vaccinated.  Past Medical History:  Diagnosis Date  . Esophageal reflux   . Hypertension   . IBS (irritable bowel syndrome)   . Migraine   . Numbness   . Pain     Patient Active Problem List   Diagnosis Date Noted  . Right arm pain 12/08/2017  . Pain in limb 03/10/2016  . Paresthesia 03/10/2016  . Abnormality of gait 03/10/2016    Past Surgical History:  Procedure Laterality Date  . ABDOMINAL HYSTERECTOMY    . ABDOMINAL HYSTERECTOMY    . BREAST BIOPSY Left   . BTL    . KNEE SURGERY  2007  . TUBAL LIGATION  1999  . TUBAL LIGATION  2014   reversal  . UMBILICAL HERNIA REPAIR N/A 03/22/2020   Procedure: UMBILICAL HERNIA REPAIR WITH MESH;  Surgeon: Griselda Miner, MD;  Location: [facility];  Service: General;  Laterality: N/A;    OB History   No obstetric history on file.      Home Medications    Prior to Admission medications   Medication Sig Start Date End Date Taking? Authorizing Provider  albuterol (VENTOLIN HFA) 108 (90 Base) MCG/ACT inhaler Inhale 2 puffs into the lungs every 6 (six) hours as needed for wheezing or shortness of breath. 08/10/20   Hall-Potvin, [place], PA-C  amLODipine-benazepril (LOTREL) 10-20 MG capsule Take 1 capsule by mouth daily.  02/11/16   [provider]  benzonatate (TESSALON) 100 MG capsule Take 1 capsule (100 mg total) by mouth every 8 (eight) hours. 08/10/20   Hall-Potvin,  [place], PA-C  cetirizine (ZYRTEC ALLERGY) 10 MG tablet Take 1 tablet (10 mg total) by mouth daily. 08/10/20   Hall-Potvin, [place], PA-C  Cholecalciferol (VITAMIN D3) 3000 units TABS Take 3,000 Units by mouth daily.    [provider]  Diclofenac Sodium (PENNSAID) 2 % SOLN Place 2 Pump onto the skin 2 (two) times daily.    [provider]  DULoxetine (CYMBALTA) 60 MG capsule Take 1 capsule (60 mg total) by mouth daily. 12/20/17   Levert Feinstein, MD  estradiol (ESTRACE) 2 MG tablet Take 2 mg by mouth daily.    [provider]  fluticasone (FLONASE) 50 MCG/ACT nasal spray Place 1 spray into both nostrils daily. 08/10/20   Hall-Potvin, [place], PA-C  omeprazole (PRILOSEC) 40 MG capsule Take 40 mg by mouth daily.    [provider]  predniSONE (DELTASONE) 50 MG tablet Take 1 tablet (50 mg total) by mouth daily with breakfast. 08/10/20   Hall-Potvin, [place], PA-C  pregabalin (LYRICA) 150 MG capsule Take 200 mg by mouth in the morning, at noon, and at bedtime.     [provider]  Spacer/Aero-Holding Chambers (AEROCHAMBER PLUS FLO-VU MEDIUM) MISC 1 each by Other route once for 1 dose. 08/10/20 08/10/20  Hall-Potvin, [place], PA-C  tiZANidine (ZANAFLEX) 4 MG tablet Take 4 mg by mouth at bedtime.    [provider]  topiramate (TOPAMAX) 25 MG capsule Take 25 mg by mouth 2 (two) times daily.    [provider]  venlafaxine (EFFEXOR) 100 MG tablet Take 150 mg by mouth in the morning.    [provider]  venlafaxine (EFFEXOR) 75 MG tablet Take 75 mg by mouth daily in the afternoon.    [provider]    Family History Family History  Problem Relation Age of Onset  . Hypertension Father   . Hypertension Mother     Social History Social History   Tobacco Use  . Smoking status: Never Smoker  . Smokeless tobacco: Never Used  Vaping Use  . Vaping Use: Never used  Substance Use Topics  . Alcohol use: Yes    Comment: SOCIAL   . Drug use: No     Allergies   Patient has no known allergies.   Review of Systems As per HPI   Physical Exam Triage Vital Signs ED Triage Vitals [08/10/20 1222]  Enc Vitals Group     BP (!) 131/96     Pulse Rate 81     Resp 18     Temp 98.1 F (36.7 C)     Temp Source Oral     SpO2 97 %     Weight      Height      Head Circumference      Peak Flow      Pain Score 5     Pain Loc      Pain Edu?      Excl. in GC?    No data found.  Updated Vital Signs BP (!) 131/96 (BP Location: Right Arm)   Pulse 81   Temp 98.1 F (36.7 C) (Oral)   Resp 18   LMP 09/18/2014   SpO2 97%   Visual Acuity Right Eye Distance:   Left Eye Distance:   Bilateral Distance:    Right Eye Near:   Left Eye Near:    Bilateral Near:     Physical Exam Constitutional:      General: She is not in acute distress.    Appearance: She is not ill-appearing or diaphoretic.  HENT:     Head: Normocephalic and atraumatic.     Mouth/Throat:     Mouth: Mucous membranes are moist.     Pharynx: Oropharynx is clear. No oropharyngeal exudate or posterior oropharyngeal erythema.  Eyes:     General: No scleral icterus.    Conjunctiva/sclera: Conjunctivae normal.     Pupils: Pupils are equal, round, and reactive to light.  Neck:     Comments: Trachea midline, negative JVD Cardiovascular:     Rate and Rhythm: Normal rate and regular rhythm.     Heart sounds: No murmur heard.  No gallop.   Pulmonary:     Effort: Pulmonary effort is normal. No respiratory distress.     Breath sounds: Wheezing and rhonchi present. No rales.     Comments: Mild, scattered.  No prolonged expiratory phase.  Good air entry bilaterally. Musculoskeletal:     Cervical back: Neck supple. No tenderness.  Lymphadenopathy:     Cervical: No cervical adenopathy.  Skin:    Capillary Refill: Capillary refill takes less than 2 seconds.     Coloration: Skin is not jaundiced or pale.     Findings: No rash.  Neurological:      General: No focal deficit present.     Mental Status: She is alert and oriented to person, place, and time.  UC Treatments / Results  Labs (all labs ordered are listed, but only abnormal results are displayed) Labs Reviewed  NOVEL CORONAVIRUS, NAA    EKG   Radiology No results found.  Procedures Procedures (including critical care time)  Medications Ordered in UC Medications - No data to display  Initial Impression / Assessment and Plan / UC Course  I have reviewed the triage vital signs and the nursing notes.  Pertinent labs & imaging results that were available during my care of the patient were reviewed by me and considered in my medical decision making (see chart for details).     Patient afebrile, nontoxic, with SpO2 97%.  H&P concerning for Covid-Covid PCR pending.  Patient to quarantine until results are back.  We will treat supportively as outlined below.  Return precautions discussed, patient verbalized understanding and is agreeable to plan. Final Clinical Impressions(s) / UC Diagnoses   Final diagnoses:  Encounter for screening laboratory testing for COVID-19 virus  URI with cough and congestion     Discharge Instructions     Tessalon for cough. Start flonase, atrovent nasal spray for nasal congestion/drainage. You can use over the counter nasal saline rinse such as neti pot for nasal congestion. Keep hydrated, your urine should be clear to pale yellow in color. Tylenol/motrin for fever and pain. Monitor for any worsening of symptoms, chest pain, shortness of breath, wheezing, swelling of the throat, go to the emergency department for further evaluation needed.     ED Prescriptions    Medication Sig Dispense Auth. Provider   cetirizine (ZYRTEC ALLERGY) 10 MG tablet Take 1 tablet (10 mg total) by mouth daily. 30 tablet Hall-Potvin, [place], PA-C   fluticasone (FLONASE) 50 MCG/ACT nasal spray Place 1 spray into both nostrils daily. 16 g Hall-Potvin,  [place], PA-C   benzonatate (TESSALON) 100 MG capsule Take 1 capsule (100 mg total) by mouth every 8 (eight) hours. 21 capsule Hall-Potvin, [place], PA-C   albuterol (VENTOLIN HFA) 108 (90 Base) MCG/ACT inhaler Inhale 2 puffs into the lungs every 6 (six) hours as needed for wheezing or shortness of breath. 8 g Hall-Potvin, [place], PA-C   Spacer/Aero-Holding Chambers (AEROCHAMBER PLUS FLO-VU MEDIUM) MISC 1 each by Other route once for 1 dose. 1 each Hall-Potvin, [place], PA-C   predniSONE (DELTASONE) 50 MG tablet Take 1 tablet (50 mg total) by mouth daily with breakfast. 5 tablet Hall-Potvin, [place], PA-C     PDMP not reviewed this encounter.   Hall-Potvin, [place], [place] 08/10/20 1300

## 2020-08-10 NOTE — Discharge Instructions (Signed)

## 2020-08-12 LAB — NOVEL CORONAVIRUS, NAA: SARS-CoV-2, NAA: NOT DETECTED

## 2020-08-12 LAB — SARS-COV-2, NAA 2 DAY TAT

## 2020-08-20 ENCOUNTER — Ambulatory Visit
Admission: EM | Admit: 2020-08-20 | Discharge: 2020-08-20 | Disposition: A | Discharge status: Home / Self Care | Payer: BC Managed Care – PPO | Attending: Emergency Medicine | Admitting: Emergency Medicine

## 2020-08-20 ENCOUNTER — Encounter: Payer: Self-pay | Admitting: Emergency Medicine

## 2020-08-20 ENCOUNTER — Other Ambulatory Visit: Payer: Self-pay

## 2020-08-20 DIAGNOSIS — R059 Cough, unspecified: Secondary | ICD-10-CM | POA: Diagnosis not present

## 2020-08-20 MED ORDER — BENZONATATE 100 MG PO CAPS
100.0000 mg | ORAL_CAPSULE | Freq: Three times a day (TID) | ORAL | 0 refills | Status: DC
Start: 2020-08-20 — End: 2023-09-06

## 2020-08-20 MED ORDER — GUAIFENESIN-DM 100-10 MG/5ML PO SYRP
5.0000 mL | ORAL_SOLUTION | ORAL | 0 refills | Status: DC | PRN
Start: 2020-08-20 — End: 2023-03-22

## 2020-08-20 NOTE — ED Provider Notes (Signed)
[facility]    CSN: [id number] Arrival date & time: 08/20/20  0809      History   Chief Complaint Chief Complaint  Patient presents with  . Cough    HPI Natalie Hunt is a 49 y.o. female  Presenting for persistent cough and scratchy throat.  States cough is dry, nonproductive and without difficulty breathing.  Ppatient initially seen by me for this on 08/10/2020: Given supportive care including prednisone, albuterol, antihistamines, benzonatate.  Covid negative.  Patient feels cough is since improved.  Benzonatate and prednisone were helpful.  Has not needed albuterol inhaler recently.  Denying wheezing, difficulty breathing, chest pain, fever.  No difficulty swallowing.  Past Medical History:  Diagnosis Date  . Esophageal reflux   . Hypertension   . IBS (irritable bowel syndrome)   . Migraine   . Numbness   . Pain     Patient Active Problem List   Diagnosis Date Noted  . Right arm pain 12/08/2017  . Pain in limb 03/10/2016  . Paresthesia 03/10/2016  . Abnormality of gait 03/10/2016    Past Surgical History:  Procedure Laterality Date  . ABDOMINAL HYSTERECTOMY    . ABDOMINAL HYSTERECTOMY    . BREAST BIOPSY Left   . BTL    . KNEE SURGERY  2007  . TUBAL LIGATION  1999  . TUBAL LIGATION  2014   reversal  . UMBILICAL HERNIA REPAIR N/A 03/22/2020   Procedure: UMBILICAL HERNIA REPAIR WITH MESH;  Surgeon: Griselda Miner, MD;  [facility];  Service: General;  Laterality: N/A;    OB History   No obstetric history on file.      Home Medications    Prior to Admission medications   Medication Sig Start Date End Date Taking? Authorizing Provider  albuterol (VENTOLIN HFA) 108 (90 Base) MCG/ACT inhaler Inhale 2 puffs into the lungs every 6 (six) hours as needed for wheezing or shortness of breath. 08/10/20   Hall-Potvin, [place], PA-C  amLODipine-benazepril (LOTREL) 10-20 MG capsule Take 1 capsule by mouth daily.  02/11/16    [provider]  benzonatate (TESSALON) 100 MG capsule Take 1 capsule (100 mg total) by mouth every 8 (eight) hours. 08/20/20   Hall-Potvin, [place], PA-C  cetirizine (ZYRTEC ALLERGY) 10 MG tablet Take 1 tablet (10 mg total) by mouth daily. 08/10/20   Hall-Potvin, [place], PA-C  Cholecalciferol (VITAMIN D3) 3000 units TABS Take 3,000 Units by mouth daily.    [provider]  Diclofenac Sodium (PENNSAID) 2 % SOLN Place 2 Pump onto the skin 2 (two) times daily.    [provider]  DULoxetine (CYMBALTA) 60 MG capsule Take 1 capsule (60 mg total) by mouth daily. 12/20/17   Levert Feinstein, MD  estradiol (ESTRACE) 2 MG tablet Take 2 mg by mouth daily.    [provider]  fluticasone (FLONASE) 50 MCG/ACT nasal spray Place 1 spray into both nostrils daily. 08/10/20   Hall-Potvin, [place], PA-C  guaiFENesin-dextromethorphan (ROBITUSSIN DM) 100-10 MG/5ML syrup Take 5 mLs by mouth every 4 (four) hours as needed for cough. 08/20/20   Hall-Potvin, [place], PA-C  omeprazole (PRILOSEC) 40 MG capsule Take 40 mg by mouth daily.    [provider]  predniSONE (DELTASONE) 50 MG tablet Take 1 tablet (50 mg total) by mouth daily with breakfast. Patient not taking: Reported on 08/20/2020 08/10/20   Hall-Potvin, [place], PA-C  pregabalin (LYRICA) 150 MG capsule Take 200 mg by mouth in the morning, at noon, and at  bedtime.     [provider]  tiZANidine (ZANAFLEX) 4 MG tablet Take 4 mg by mouth at bedtime.    [provider]  topiramate (TOPAMAX) 25 MG capsule Take 25 mg by mouth 2 (two) times daily.    [provider]  venlafaxine (EFFEXOR) 100 MG tablet Take 150 mg by mouth in the morning.    [provider]  venlafaxine (EFFEXOR) 75 MG tablet Take 75 mg by mouth daily in the afternoon.    [provider]    Family History Family History  Problem Relation Age of Onset  . Hypertension Father   . Hypertension Mother     Social  History Social History   Tobacco Use  . Smoking status: Never Smoker  . Smokeless tobacco: Never Used  Vaping Use  . Vaping Use: Never used  Substance Use Topics  . Alcohol use: Yes    Comment: SOCIAL  . Drug use: No     Allergies   Patient has no known allergies.   Review of Systems As per HPI   Physical Exam Triage Vital Signs ED Triage Vitals  Enc Vitals Group     BP      Pulse      Resp      Temp      Temp src      SpO2      Weight      Height      Head Circumference      Peak Flow      Pain Score      Pain Loc      Pain Edu?      Excl. in GC?    No data found.  Updated Vital Signs BP (!) 136/96 (BP Location: Left Arm)   Pulse 84   Temp 98.4 F (36.9 C) (Oral)   Resp 18   LMP 09/18/2014   SpO2 96%   Visual Acuity Right Eye Distance:   Left Eye Distance:   Bilateral Distance:    Right Eye Near:   Left Eye Near:    Bilateral Near:     Physical Exam Constitutional:      General: She is not in acute distress.    Appearance: She is not ill-appearing or diaphoretic.  HENT:     Head: Normocephalic and atraumatic.     Mouth/Throat:     Mouth: Mucous membranes are moist.     Pharynx: Oropharynx is clear. No oropharyngeal exudate or posterior oropharyngeal erythema.  Eyes:     General: No scleral icterus.    Conjunctiva/sclera: Conjunctivae normal.     Pupils: Pupils are equal, round, and reactive to light.  Neck:     Comments: Trachea midline, negative JVD Cardiovascular:     Rate and Rhythm: Normal rate and regular rhythm.     Heart sounds: No murmur heard.  No gallop.   Pulmonary:     Effort: Pulmonary effort is normal. No respiratory distress.     Breath sounds: No wheezing, rhonchi or rales.  Musculoskeletal:     Cervical back: Neck supple. No tenderness.  Lymphadenopathy:     Cervical: No cervical adenopathy.  Skin:    Capillary Refill: Capillary refill takes less than 2 seconds.     Coloration: Skin is not jaundiced or pale.      Findings: No rash.  Neurological:     General: No focal deficit present.     Mental Status: She is alert and oriented to person,  place, and time.      UC Treatments / Results  Labs (all labs ordered are listed, but only abnormal results are displayed) Labs Reviewed - No data to display  EKG   Radiology No results found.  Procedures Procedures (including critical care time)  Medications Ordered in UC Medications - No data to display  Initial Impression / Assessment and Plan / UC Course  I have reviewed the triage vital signs and the nursing notes.  Pertinent labs & imaging results that were available during my care of the patient were reviewed by me and considered in my medical decision making (see chart for details).     Afebrile, nontoxic in office today.  Pulmonary exam much improved from previous evaluation.  Cough is now dry.  Low concern for pneumonia: X-ray deferred.  Will treat cough supportively as below.  Return precautions discussed, pt verbalized understanding and is agreeable to plan. Final Clinical Impressions(s) / UC Diagnoses   Final diagnoses:  Cough   Discharge Instructions   None    ED Prescriptions    Medication Sig Dispense Auth. Provider   benzonatate (TESSALON) 100 MG capsule Take 1 capsule (100 mg total) by mouth every 8 (eight) hours. 21 capsule Hall-Potvin, [place], PA-C   guaiFENesin-dextromethorphan (ROBITUSSIN DM) 100-10 MG/5ML syrup Take 5 mLs by mouth every 4 (four) hours as needed for cough. 118 mL Hall-Potvin, [place], PA-C     PDMP not reviewed this encounter.   Hall-Potvin, [place], [place] 08/20/20 [phone]

## 2020-08-20 NOTE — ED Triage Notes (Signed)
Pt here with continued cough since being seen last week; pt had negative covid test; pt sts right sided pain worse with cough; denies fever

## 2020-09-11 ENCOUNTER — Other Ambulatory Visit: Payer: Self-pay | Admitting: Obstetrics & Gynecology

## 2020-09-11 ENCOUNTER — Other Ambulatory Visit: Payer: Self-pay | Admitting: Internal Medicine

## 2020-09-11 DIAGNOSIS — Z1231 Encounter for screening mammogram for malignant neoplasm of breast: Secondary | ICD-10-CM

## 2020-10-23 ENCOUNTER — Ambulatory Visit: Payer: BC Managed Care – PPO

## 2020-12-11 ENCOUNTER — Ambulatory Visit
Admission: RE | Admit: 2020-12-11 | Discharge: 2020-12-11 | Disposition: A | Discharge status: Home / Self Care | Payer: BC Managed Care – PPO | Source: Ambulatory Visit | Attending: Internal Medicine | Admitting: Internal Medicine

## 2020-12-11 ENCOUNTER — Other Ambulatory Visit: Payer: Self-pay

## 2020-12-11 DIAGNOSIS — Z1231 Encounter for screening mammogram for malignant neoplasm of breast: Secondary | ICD-10-CM

## 2021-07-20 ENCOUNTER — Encounter (HOSPITAL_COMMUNITY): Payer: Self-pay

## 2021-07-20 ENCOUNTER — Emergency Department (HOSPITAL_COMMUNITY)
Admission: EM | Admit: 2021-07-20 | Discharge: 2021-07-20 | Disposition: A | Discharge status: Home / Self Care | Payer: 59 | Attending: Emergency Medicine | Admitting: Emergency Medicine

## 2021-07-20 ENCOUNTER — Emergency Department (HOSPITAL_COMMUNITY): Payer: 59

## 2021-07-20 DIAGNOSIS — Y92410 Unspecified street and highway as the place of occurrence of the external cause: Secondary | ICD-10-CM | POA: Diagnosis not present

## 2021-07-20 DIAGNOSIS — R0789 Other chest pain: Secondary | ICD-10-CM | POA: Insufficient documentation

## 2021-07-20 DIAGNOSIS — M546 Pain in thoracic spine: Secondary | ICD-10-CM | POA: Diagnosis not present

## 2021-07-20 DIAGNOSIS — R519 Headache, unspecified: Secondary | ICD-10-CM | POA: Insufficient documentation

## 2021-07-20 DIAGNOSIS — M25552 Pain in left hip: Secondary | ICD-10-CM | POA: Diagnosis present

## 2021-07-20 DIAGNOSIS — M542 Cervicalgia: Secondary | ICD-10-CM | POA: Insufficient documentation

## 2021-07-20 LAB — CBC
HCT: 39.3 % (ref 36.0–46.0)
Hemoglobin: 12.9 g/dL (ref 12.0–15.0)
MCH: 30.1 pg (ref 26.0–34.0)
MCHC: 32.8 g/dL (ref 30.0–36.0)
MCV: 91.6 fL (ref 80.0–100.0)
Platelets: 322 10*3/uL (ref 150–400)
RBC: 4.29 MIL/uL (ref 3.87–5.11)
RDW: 14 % (ref 11.5–15.5)
WBC: 7.6 10*3/uL (ref 4.0–10.5)
nRBC: 0 % (ref 0.0–0.2)

## 2021-07-20 LAB — COMPREHENSIVE METABOLIC PANEL
ALT: 12 U/L (ref 0–44)
AST: 23 U/L (ref 15–41)
Albumin: 4.4 g/dL (ref 3.5–5.0)
Alkaline Phosphatase: 95 U/L (ref 38–126)
Anion gap: 11 (ref 5–15)
BUN: 6 mg/dL (ref 6–20)
CO2: 25 mmol/L (ref 22–32)
Calcium: 10.2 mg/dL (ref 8.9–10.3)
Chloride: 105 mmol/L (ref 98–111)
Creatinine, Ser: 0.76 mg/dL (ref 0.44–1.00)
GFR, Estimated: >60 mL/min (ref >60)
Glucose, Bld: 101 mg/dL — ABNORMAL HIGH (ref 70–99)
Potassium: 3.6 mmol/L (ref 3.5–5.1)
Sodium: 141 mmol/L (ref 135–145)
Total Bilirubin: 0.4 mg/dL (ref 0.3–1.2)
Total Protein: 7.5 g/dL (ref 6.5–8.1)

## 2021-07-20 MED ORDER — OXYCODONE-ACETAMINOPHEN 5-325 MG PO TABS
1.0000 | ORAL_TABLET | Freq: Once | ORAL | Status: AC
  Administered 2021-07-20: 1 via ORAL
  Filled 2021-07-20: qty 1

## 2021-07-20 NOTE — ED Notes (Signed)
PT decided to leave AMA °
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
## 2021-07-21 ENCOUNTER — Emergency Department (HOSPITAL_COMMUNITY)
Admission: EM | Admit: 2021-07-21 | Discharge: 2021-07-21 | Disposition: A | Discharge status: Home / Self Care | Payer: 59 | Attending: Emergency Medicine | Admitting: Emergency Medicine

## 2021-07-21 ENCOUNTER — Encounter (HOSPITAL_COMMUNITY): Payer: Self-pay | Admitting: Emergency Medicine

## 2021-07-21 ENCOUNTER — Other Ambulatory Visit: Payer: Self-pay

## 2021-07-21 ENCOUNTER — Emergency Department (HOSPITAL_COMMUNITY): Payer: 59

## 2021-07-21 DIAGNOSIS — Z79899 Other long term (current) drug therapy: Secondary | ICD-10-CM | POA: Diagnosis not present

## 2021-07-21 DIAGNOSIS — I10 Essential (primary) hypertension: Secondary | ICD-10-CM | POA: Insufficient documentation

## 2021-07-21 DIAGNOSIS — R109 Unspecified abdominal pain: Secondary | ICD-10-CM | POA: Diagnosis not present

## 2021-07-21 DIAGNOSIS — S20312A Abrasion of left front wall of thorax, initial encounter: Secondary | ICD-10-CM | POA: Diagnosis not present

## 2021-07-21 DIAGNOSIS — Y92410 Unspecified street and highway as the place of occurrence of the external cause: Secondary | ICD-10-CM | POA: Diagnosis not present

## 2021-07-21 DIAGNOSIS — S299XXA Unspecified injury of thorax, initial encounter: Secondary | ICD-10-CM | POA: Diagnosis present

## 2021-07-21 DIAGNOSIS — M25512 Pain in left shoulder: Secondary | ICD-10-CM | POA: Insufficient documentation

## 2021-07-21 DIAGNOSIS — R079 Chest pain, unspecified: Secondary | ICD-10-CM

## 2021-07-21 MED ORDER — OXYCODONE-ACETAMINOPHEN 5-325 MG PO TABS: 1.0000 | ORAL_TABLET | Freq: Four times a day (QID) | ORAL | 0 refills | Status: AC | PRN

## 2021-07-21 MED ORDER — IOHEXOL 350 MG/ML SOLN
75.0000 mL | Freq: Once | INTRAVENOUS | Status: AC | PRN
  Administered 2021-07-21: 75 mL via INTRAVENOUS

## 2021-07-21 MED ORDER — OXYCODONE-ACETAMINOPHEN 5-325 MG PO TABS
1.0000 | ORAL_TABLET | Freq: Once | ORAL | Status: AC
Start: 2021-07-21 — End: 2021-07-21
  Administered 2021-07-21: 1 via ORAL
  Filled 2021-07-21: qty 1

## 2021-07-21 MED ORDER — LIDOCAINE 5 % EX PTCH: 1.0000 | MEDICATED_PATCH | CUTANEOUS | 0 refills | Status: DC
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
## 2021-11-24 ENCOUNTER — Other Ambulatory Visit: Payer: Self-pay | Admitting: Internal Medicine

## 2021-11-24 DIAGNOSIS — Z1231 Encounter for screening mammogram for malignant neoplasm of breast: Secondary | ICD-10-CM

## 2021-12-15 ENCOUNTER — Ambulatory Visit
Admission: RE | Admit: 2021-12-15 | Discharge: 2021-12-15 | Disposition: A | Discharge status: Home / Self Care | Payer: 59 | Source: Ambulatory Visit | Attending: Internal Medicine | Admitting: Internal Medicine

## 2021-12-15 DIAGNOSIS — Z1231 Encounter for screening mammogram for malignant neoplasm of breast: Secondary | ICD-10-CM

## 2022-06-26 ENCOUNTER — Other Ambulatory Visit: Payer: Self-pay | Admitting: Internal Medicine

## 2022-06-26 DIAGNOSIS — Z1231 Encounter for screening mammogram for malignant neoplasm of breast: Secondary | ICD-10-CM

## 2022-07-13 ENCOUNTER — Other Ambulatory Visit: Payer: Self-pay | Admitting: Internal Medicine

## 2022-07-13 DIAGNOSIS — N644 Mastodynia: Secondary | ICD-10-CM

## 2022-07-18 ENCOUNTER — Ambulatory Visit: Payer: 59

## 2022-07-18 ENCOUNTER — Ambulatory Visit
Admission: RE | Admit: 2022-07-18 | Discharge: 2022-07-18 | Disposition: A | Discharge status: Home / Self Care | Payer: 59 | Source: Ambulatory Visit | Attending: Internal Medicine | Admitting: Internal Medicine

## 2022-07-18 DIAGNOSIS — N644 Mastodynia: Secondary | ICD-10-CM

## 2023-01-28 ENCOUNTER — Ambulatory Visit: Payer: 59

## 2023-03-12 ENCOUNTER — Ambulatory Visit
Admission: RE | Admit: 2023-03-12 | Discharge: 2023-03-12 | Disposition: A | Discharge status: Home / Self Care | Payer: BLUE CROSS/BLUE SHIELD | Source: Ambulatory Visit | Attending: Internal Medicine | Admitting: Internal Medicine

## 2023-03-12 DIAGNOSIS — Z1231 Encounter for screening mammogram for malignant neoplasm of breast: Secondary | ICD-10-CM

## 2023-03-15 ENCOUNTER — Encounter: Payer: Self-pay | Admitting: Plastic Surgery

## 2023-03-15 ENCOUNTER — Ambulatory Visit (INDEPENDENT_AMBULATORY_CARE_PROVIDER_SITE_OTHER): Payer: BC Managed Care – PPO | Admitting: Plastic Surgery

## 2023-03-15 VITALS — BP 151/98 | HR 97 | Ht 61.0 in | Wt 154.6 lb

## 2023-03-15 DIAGNOSIS — L989 Disorder of the skin and subcutaneous tissue, unspecified: Secondary | ICD-10-CM

## 2023-03-15 NOTE — Addendum Note (Signed)
Addended by: Verdie Shire on: 03/15/2023 11:38 AM   Modules accepted: Orders

## 2023-03-15 NOTE — Addendum Note (Signed)
Addended by: Verdie Shire on: 03/15/2023 03:28 PM   Modules accepted: Orders

## 2023-03-15 NOTE — Addendum Note (Signed)
Addended by: Verdie Shire on: 03/15/2023 03:51 PM   Modules accepted: Orders

## 2023-03-15 NOTE — Progress Notes (Signed)
Referring Provider [facility] [address] [place],  [place] [address]   CC:  Chief Complaint  Patient presents with   Consult      Natalie Hunt is an 52 y.o. female.  HPI: Natalie Hunt is a 52 year old female who presents today with a small cyst between her breasts that she would like to have removed.  She she states it has been there for quite a while that she is unsure exactly how long.  It has been growing in size and now is uncomfortable and rubs against her bra.  She would like to have it removed.  No Known Allergies  Outpatient Encounter Medications as of 03/15/2023  Medication Sig   albuterol (VENTOLIN HFA) 108 (90 Base) MCG/ACT inhaler Inhale 2 puffs into the lungs every 6 (six) hours as needed for wheezing or shortness of breath.   amLODipine-benazepril (LOTREL) 10-20 MG capsule Take 1 capsule by mouth daily.    benzonatate (TESSALON) 100 MG capsule Take 1 capsule (100 mg total) by mouth every 8 (eight) hours.   cetirizine (ZYRTEC ALLERGY) 10 MG tablet Take 1 tablet (10 mg total) by mouth daily.   Cholecalciferol (VITAMIN D3) 3000 units TABS Take 3,000 Units by mouth daily.   Diclofenac Sodium (PENNSAID) 2 % SOLN Place 2 Pump onto the skin 2 (two) times daily.   DULoxetine (CYMBALTA) 60 MG capsule Take 1 capsule (60 mg total) by mouth daily.   estradiol (ESTRACE) 2 MG tablet Take 2 mg by mouth daily.   fluticasone (FLONASE) 50 MCG/ACT nasal spray Place 1 spray into both nostrils daily.   guaiFENesin-dextromethorphan (ROBITUSSIN DM) 100-10 MG/5ML syrup Take 5 mLs by mouth every 4 (four) hours as needed for cough.   lidocaine (LIDODERM) 5 % Place 1 patch onto the skin daily. Remove & Discard patch within 12 hours or as directed by MD   omeprazole (PRILOSEC) 40 MG capsule Take 40 mg by mouth daily.   oxyCODONE-acetaminophen (PERCOCET/ROXICET) 5-325 MG tablet Take 1 tablet by mouth every 6 (six) hours as needed for severe pain.   predniSONE (DELTASONE) 50 MG tablet  Take 1 tablet (50 mg total) by mouth daily with breakfast. (Patient not taking: Reported on 08/20/2020)   pregabalin (LYRICA) 150 MG capsule Take 200 mg by mouth in the morning, at noon, and at bedtime.    tiZANidine (ZANAFLEX) 4 MG tablet Take 4 mg by mouth at bedtime.   topiramate (TOPAMAX) 25 MG capsule Take 25 mg by mouth 2 (two) times daily.   venlafaxine (EFFEXOR) 100 MG tablet Take 150 mg by mouth in the morning.   venlafaxine (EFFEXOR) 75 MG tablet Take 75 mg by mouth daily in the afternoon.   No facility-administered encounter medications on file as of 03/15/2023.     Past Medical History:  Diagnosis Date   Esophageal reflux    Hypertension    IBS (irritable bowel syndrome)    Migraine    Numbness    Pain     Past Surgical History:  Procedure Laterality Date   ABDOMINAL HYSTERECTOMY     ABDOMINAL HYSTERECTOMY     BREAST BIOPSY Left 06/07/2012   fibroadenoma    BTL     KNEE SURGERY  2007   TUBAL LIGATION  1999   TUBAL LIGATION  2014   reversal   UMBILICAL HERNIA REPAIR N/A 03/22/2020   Procedure: UMBILICAL HERNIA REPAIR WITH MESH;  Surgeon: Griselda Miner, MD;  Location: [facility];  Service: General;  Laterality:  N/A;    Family History  Problem Relation Age of Onset   Hypertension Father    Hypertension Mother     Social History   Social History Narrative   Lives at home with her husband and children.   Right-handed.   One drink one-two times per week- coffee      Review of Systems General: Denies fevers, chills, weight loss CV: Denies chest pain, shortness of breath, palpitations Skin: Small mass which patient feels rubs against her bra causes discomfort.  Physical Exam    03/15/2023   10:58 AM 07/21/2021   11:53 AM 07/21/2021   10:30 AM  Vitals with BMI  Height 5\' 1"     Weight 154 lbs 10 oz    BMI 29.23    Systolic 151 165 161  Diastolic 98 98 99  Pulse 97 65 64    General:  No acute distress,  Alert and oriented, Non-Toxic,  Normal speech and affect Integument: Patient has a small subcutaneous mass approximately 5 mm in size between her breast.  On palpation it feels very much like a sebaceous cyst. Mammogram: Mammogram in September 23 was BI-RADS 2 and did show evidence of a sebaceous cyst.  Patient had a follow-up mammogram in May 2024 the results are still pending. Assessment/Plan Skin lesion: Likely an epidermal inclusion cyst.  Will plan to excise under local in the clinic.  The risks of infection or recurrence were discussed with the patient.  She also understands that she is at higher risk for scarring due to location of the cyst over the sternum and the lateral pole of the weight of the breast.  Will proceed at her request  [name] 03/15/2023, 11:07 AM

## 2023-03-22 ENCOUNTER — Ambulatory Visit (INDEPENDENT_AMBULATORY_CARE_PROVIDER_SITE_OTHER): Payer: BC Managed Care – PPO | Admitting: Plastic Surgery

## 2023-03-22 VITALS — BP 125/81 | HR 85

## 2023-03-22 DIAGNOSIS — L720 Epidermal cyst: Secondary | ICD-10-CM | POA: Diagnosis not present

## 2023-03-22 DIAGNOSIS — L989 Disorder of the skin and subcutaneous tissue, unspecified: Secondary | ICD-10-CM

## 2023-03-22 NOTE — Addendum Note (Signed)
Addended by: Drema Dallas K on: 03/22/2023 11:09 AM   Modules accepted: Orders

## 2023-03-22 NOTE — Progress Notes (Signed)
Procedure Note  Preoperative Dx: Sternal cyst  Postoperative Dx: Same  Procedure: Excision of sternal cyst  Anesthesia: Lidocaine 1% with 1:100,000 epinephrine and 0.25% Sensorcaine   Indication for Procedure: Removal of tissue for pathology  Description of Procedure: Risks and complications were explained to the patient including the risk of recurrence.  Consent was confirmed and the patient understands the risks and benefits.  The potential complications and alternatives were explained and the patient consents.  The patient expressed understanding the option of not having the procedure and the risks of a scar.  Time out was called and all information was confirmed to be correct.    The area was prepped and drapped.  Local anesthetic was injected in the subcutaneous tissues.  After waiting for the local to take affect an incision was made over the cyst and dissection carried out down through the subcutaneous tissues until the cyst was identified.  The soft tissues around the cyst were dissected with sharp and blunt dissection and the cyst was removed intact after obtaining hemostasis, the surgical wound was closed in layers with 4-0 Monocryl and 4-0 Prolene.  The surgical wound measured 1 cm.  A dressing was applied.  The patient was given instructions on how to care for the area and a follow up appointment.  Jaymes tolerated the procedure well and there were no complications. The specimen was sent to pathology.

## 2023-03-30 ENCOUNTER — Ambulatory Visit (INDEPENDENT_AMBULATORY_CARE_PROVIDER_SITE_OTHER): Payer: BC Managed Care – PPO | Admitting: Physician Assistant

## 2023-03-30 ENCOUNTER — Encounter: Payer: BC Managed Care – PPO | Admitting: Physician Assistant

## 2023-03-30 DIAGNOSIS — L989 Disorder of the skin and subcutaneous tissue, unspecified: Secondary | ICD-10-CM

## 2023-03-30 DIAGNOSIS — Z9889 Other specified postprocedural states: Secondary | ICD-10-CM

## 2023-03-30 NOTE — Progress Notes (Signed)
This is a 52 year old female seen in our office for follow-up evaluation status post excision of her sternal cyst on 03/22/2023 by Dr. Ladona Ridgel.  Overall the patient is doing well with no significant complaints or concerns.  Pathology returned showing a cyst lined with squamous epithelium.   On exam the incision is clean dry and intact with no surrounding redness discharge or warmth, Prolene stitches in place.  Prolene stitches were removed.  The patient tolerated this well without difficulty.  I advised to avoid any strenuous activity for another several weeks, she can use Vaseline on the incision.  I instructed her to reach out to our office if she develops any new or worsening signs or symptoms.  She verbalized understanding and agreement to today's plan had no further questions or concerns.

## 2023-04-22 ENCOUNTER — Ambulatory Visit: Payer: BC Managed Care – PPO | Admitting: Plastic Surgery

## 2023-09-06 ENCOUNTER — Encounter: Payer: Self-pay | Admitting: Emergency Medicine

## 2023-09-06 ENCOUNTER — Ambulatory Visit
Admission: EM | Admit: 2023-09-06 | Discharge: 2023-09-06 | Disposition: A | Discharge status: Home / Self Care | Payer: Medicare Other | Attending: Family Medicine | Admitting: Family Medicine

## 2023-09-06 ENCOUNTER — Ambulatory Visit: Payer: Medicare Other

## 2023-09-06 DIAGNOSIS — J209 Acute bronchitis, unspecified: Secondary | ICD-10-CM

## 2023-09-06 MED ORDER — ALBUTEROL SULFATE HFA 108 (90 BASE) MCG/ACT IN AERS
2.0000 | INHALATION_SPRAY | RESPIRATORY_TRACT | 0 refills | Status: AC | PRN
Start: 2023-09-06 — End: ?

## 2023-09-06 MED ORDER — ALBUTEROL SULFATE (2.5 MG/3ML) 0.083% IN NEBU
2.5000 mg | INHALATION_SOLUTION | Freq: Once | RESPIRATORY_TRACT | Status: AC
  Administered 2023-09-06: 2.5 mg via RESPIRATORY_TRACT

## 2023-09-06 MED ORDER — PREDNISONE 20 MG PO TABS: 40.0000 mg | ORAL_TABLET | Freq: Every day | ORAL | 0 refills | Status: AC

## 2023-09-06 MED ORDER — BENZONATATE 200 MG PO CAPS: 200.0000 mg | ORAL_CAPSULE | Freq: Three times a day (TID) | ORAL | 0 refills | Status: DC | PRN

## 2023-09-06 NOTE — ED Provider Notes (Signed)
[facility]    CSN: [id number] Arrival date & time: 09/06/23  1029      History   Chief Complaint Chief Complaint  Patient presents with   Cough    HPI Michala Deblanc is a 52 y.o. female.    Cough Associated symptoms: rhinorrhea, shortness of breath and wheezing    Patient is here today for a cough.  She started with a cough 3 days ago, worsening.  Dry cough, she is wheezy, feels sob at times.  Using otc medications without any help.  NO fevers/chills.  No h/o asthma, but she smokes 3-4 cigs/day.  She was sick several weeks ago with a cough, but that improved, but then returned.  This is worse.        Past Medical History:  Diagnosis Date   Esophageal reflux    Hypertension    IBS (irritable bowel syndrome)    Migraine    Numbness    Pain     Patient Active Problem List   Diagnosis Date Noted   Right arm pain 12/08/2017   Pain in limb 03/10/2016   Paresthesia 03/10/2016   Abnormality of gait 03/10/2016    Past Surgical History:  Procedure Laterality Date   ABDOMINAL HYSTERECTOMY     ABDOMINAL HYSTERECTOMY     BREAST BIOPSY Left 06/07/2012   fibroadenoma    BTL     KNEE SURGERY  2007   TUBAL LIGATION  1999   TUBAL LIGATION  2014   reversal   UMBILICAL HERNIA REPAIR N/A 03/22/2020   Procedure: UMBILICAL HERNIA REPAIR WITH MESH;  Surgeon: Griselda Miner, MD;  Location: [facility];  Service: General;  Laterality: N/A;    OB History   No obstetric history on file.      Home Medications    Prior to Admission medications   Medication Sig Start Date End Date Taking? Authorizing Provider  amLODipine-benazepril (LOTREL) 10-20 MG capsule Take 1 capsule by mouth daily.  02/11/16   [provider]  atorvastatin (LIPITOR) 20 MG tablet Take 1 tablet by mouth daily. 01/19/23 04/19/23  [provider]  benzonatate (TESSALON) 100 MG capsule Take 1 capsule (100 mg total) by mouth every 8 (eight) hours. 08/20/20    Hall-Potvin, [place], PA-C  celecoxib (CELEBREX) 100 MG capsule Take 100 mg by mouth 2 (two) times daily. 11/21/22   [provider]  diclofenac Sodium (VOLTAREN) 1 % GEL Apply 2 g topically 4 (four) times daily.    [provider]  ergocalciferol (VITAMIN D2) 1.25 MG (50000 UT) capsule Take 50,000 Units by mouth once a week.    [provider]  estradiol (ESTRACE) 1 MG tablet Take 1 tablet by mouth daily. 01/12/23   [provider]  Fremanezumab-vfrm 225 MG/1.5ML SOAJ Inject 225 mg into the skin every 28 (twenty-eight) days. 01/19/23 01/19/24  [provider]  hydrochlorothiazide (HYDRODIURIL) 25 MG tablet Take 1 tablet by mouth daily. 11/25/22 11/25/23  [provider]  hydrOXYzine (ATARAX) 25 MG tablet Take 25 mg by mouth 3 (three) times daily.    [provider]  linaclotide Karlene Einstein) 290 MCG CAPS capsule Take 290 mcg by mouth daily before breakfast. 05/27/22   [provider]  naproxen (NAPROSYN) 500 MG tablet Take 500 mg by mouth as needed. 01/19/23 01/19/24  [provider]  omeprazole (PRILOSEC) 40 MG capsule Take 40 mg by mouth 2 (two) times daily.    [provider]  Oxcarbazepine (TRILEPTAL) 300 MG  tablet Take 300 mg by mouth in the morning, at noon, and at bedtime. 11/21/22   [provider]  oxyCODONE-acetaminophen (PERCOCET/ROXICET) 5-325 MG tablet Take 1 tablet by mouth every 6 (six) hours as needed for severe pain. 07/21/21   Henderly, Britni A, PA-C  propranolol (INDERAL) 10 MG tablet 10 mg 3 (three) times daily. 12/04/22 12/04/23  [provider]  rizatriptan (MAXALT-MLT) 10 MG disintegrating tablet Take 10 mg by mouth as needed. 01/19/23   [provider]  sertraline (ZOLOFT) 100 MG tablet Take 100 mg by mouth as directed. Take 200mg  by mouth daily. 12/04/22   [provider]  tiZANidine (ZANAFLEX) 2 MG tablet Take 2 mg by mouth 2 (two) times daily as needed for muscle  spasms.    [provider]  topiramate (TOPAMAX) 25 MG capsule Take 25 mg by mouth 2 (two) times daily.    [provider]    Family History Family History  Problem Relation Age of Onset   Hypertension Father    Hypertension Mother     Social History Social History   Tobacco Use   Smoking status: Never   Smokeless tobacco: Never  Vaping Use   Vaping status: Never Used  Substance Use Topics   Alcohol use: Yes    Comment: SOCIAL   Drug use: No     Allergies   Patient has no known allergies.   Review of Systems Review of Systems  Constitutional: Negative.   HENT:  Positive for congestion and rhinorrhea.   Respiratory:  Positive for cough, shortness of breath and wheezing.   Cardiovascular: Negative.   Gastrointestinal: Negative.   Genitourinary: Negative.   Musculoskeletal: Negative.   Psychiatric/Behavioral: Negative.       Physical Exam Triage Vital Signs ED Triage Vitals [09/06/23 1034]  Encounter Vitals Group     BP (!) 135/90     Systolic BP Percentile      Diastolic BP Percentile      Pulse Rate (!) 104     Resp 16     Temp 98.4 F (36.9 C)     Temp Source Oral     SpO2 94 %     Weight      Height      Head Circumference      Peak Flow      Pain Score      Pain Loc      Pain Education      Exclude from Growth Chart    No data found.  Updated Vital Signs BP (!) 135/90 (BP Location: Left Arm)   Pulse (!) 104   Temp 98.4 F (36.9 C) (Oral)   Resp 16   LMP 09/18/2014   SpO2 94%   Visual Acuity Right Eye Distance:   Left Eye Distance:   Bilateral Distance:    Right Eye Near:   Left Eye Near:    Bilateral Near:     Physical Exam Constitutional:      Appearance: Normal appearance.  HENT:     Nose: Nose normal. No congestion or rhinorrhea.     Mouth/Throat:     Mouth: Mucous membranes are moist.  Cardiovascular:     Rate and Rhythm: Normal rate.  Pulmonary:     Effort: Pulmonary effort is normal.     Breath  sounds: Wheezing and rhonchi present.  Musculoskeletal:     Cervical back: Normal range of motion and neck supple.  Skin:    General: Skin is  warm.  Neurological:     General: No focal deficit present.     Mental Status: She is alert.  Psychiatric:        Mood and Affect: Mood normal.      UC Treatments / Results  Labs (all labs ordered are listed, but only abnormal results are displayed) Labs Reviewed - No data to display  EKG   Radiology No results found.  Procedures Procedures (including critical care time)  Medications Ordered in UC Medications  albuterol (PROVENTIL) (2.5 MG/3ML) 0.083% nebulizer solution 2.5 mg (has no administration in time range)    Initial Impression / Assessment and Plan / UC Course  I have reviewed the triage vital signs and the nursing notes.  Pertinent labs & imaging results that were available during my care of the patient were reviewed by me and considered in my medical decision making (see chart for details).     Final Clinical Impressions(s) / UC Diagnoses   Final diagnoses:  Acute bronchitis, unspecified organism     Discharge Instructions      You were seen today for cough.  Your xray appears normal, but if the radiologist sees something different I will call to notify you.  I have sent out a cough pill, an inhaler and prednisone to help with your symptoms.  Please return if you are not improving or worsening.     ED Prescriptions     Medication Sig Dispense Auth. Provider   albuterol (VENTOLIN HFA) 108 (90 Base) MCG/ACT inhaler Inhale 2 puffs into the lungs every 4 (four) hours as needed for wheezing or shortness of breath. 1 each Gianlucas Evenson, MD   benzonatate (TESSALON) 200 MG capsule Take 1 capsule (200 mg total) by mouth 3 (three) times daily as needed for cough. 21 capsule Scout Guyett, MD   predniSONE (DELTASONE) 20 MG tablet Take 2 tablets (40 mg total) by mouth daily for 5 days. 10 tablet Jannifer Franklin, MD       PDMP not reviewed this encounter.   Jannifer Franklin, MD 09/06/23 1208

## 2023-09-06 NOTE — ED Triage Notes (Signed)
Had URI 2 weeks ago, felt better, began symptoms again Friday and continued over the weekend. Reports wheezing cough over the weekend. Denies hx of asthma, does smoke regularly. Reports runny nose also. Denies sore throat, headache, N/V/D

## 2023-09-06 NOTE — Discharge Instructions (Signed)
You were seen today for cough.  Your xray appears normal, but if the radiologist sees something different I will call to notify you.  I have sent out a cough pill, an inhaler and prednisone to help with your symptoms.  Please return if you are not improving or worsening.

## 2023-09-17 ENCOUNTER — Ambulatory Visit
Admission: EM | Admit: 2023-09-17 | Discharge: 2023-09-17 | Disposition: A | Discharge status: Home / Self Care | Payer: Medicare Other | Attending: Family Medicine | Admitting: Family Medicine

## 2023-09-17 ENCOUNTER — Other Ambulatory Visit: Payer: Self-pay

## 2023-09-17 DIAGNOSIS — J209 Acute bronchitis, unspecified: Secondary | ICD-10-CM

## 2023-09-17 MED ORDER — DOXYCYCLINE HYCLATE 100 MG PO CAPS: 100.0000 mg | ORAL_CAPSULE | Freq: Two times a day (BID) | ORAL | 0 refills | Status: AC

## 2023-09-17 MED ORDER — IPRATROPIUM-ALBUTEROL 0.5-2.5 (3) MG/3ML IN SOLN
3.0000 mL | Freq: Once | RESPIRATORY_TRACT | Status: AC
  Administered 2023-09-17: 3 mL via RESPIRATORY_TRACT

## 2023-09-17 MED ORDER — PREDNISONE 20 MG PO TABS: 40.0000 mg | ORAL_TABLET | Freq: Every day | ORAL | 0 refills | Status: AC

## 2023-09-17 MED ORDER — BENZONATATE 200 MG PO CAPS: 200.0000 mg | ORAL_CAPSULE | Freq: Three times a day (TID) | ORAL | 0 refills | Status: DC | PRN

## 2023-09-17 NOTE — ED Triage Notes (Signed)
Patient states she continues to have cough x 3 weeks, left side of chest hurts from coughing, Mucus and congestion. Treated with cough drops, Theraflu and inhaler without relief.

## 2023-09-17 NOTE — Discharge Instructions (Addendum)
You were seen today for continued cough.  I have sent out an antibiotic today, along with another round of steroids and cough medication.  If not improving then please return or go to the ER for further evaluation.

## 2023-09-17 NOTE — ED Provider Notes (Signed)
[facility]    CSN: [id number] Arrival date & time: 09/17/23  1441      History   Chief Complaint No chief complaint on file.   HPI Natalie Hunt is a 52 y.o. female.   Patient is here for continued cough.  She was seen here 3 weeks ago after having symptoms x 3 days.  Chest xray was negative at that time.  She was treated with a nebulizer in office, and then rx for prednisone, inhaler and tessalon She thinks this did help some.  The cough is not as bad as it was, but still coughing a lot, and worse at night.    No new fevers, no n/v.  Some sob with heavy coughing.  Runny nose, congestion comes/goes.        Past Medical History:  Diagnosis Date   Esophageal reflux    Hypertension    IBS (irritable bowel syndrome)    Migraine    Numbness    Pain     Patient Active Problem List   Diagnosis Date Noted   Right arm pain 12/08/2017   Pain in limb 03/10/2016   Paresthesia 03/10/2016   Abnormality of gait 03/10/2016    Past Surgical History:  Procedure Laterality Date   ABDOMINAL HYSTERECTOMY     ABDOMINAL HYSTERECTOMY     BREAST BIOPSY Left 06/07/2012   fibroadenoma    BTL     KNEE SURGERY  2007   TUBAL LIGATION  1999   TUBAL LIGATION  2014   reversal   UMBILICAL HERNIA REPAIR N/A 03/22/2020   Procedure: UMBILICAL HERNIA REPAIR WITH MESH;  Surgeon: Griselda Miner, MD;  Location: [facility];  Service: General;  Laterality: N/A;    OB History   No obstetric history on file.      Home Medications    Prior to Admission medications   Medication Sig Start Date End Date Taking? Authorizing Provider  albuterol (VENTOLIN HFA) 108 (90 Base) MCG/ACT inhaler Inhale 2 puffs into the lungs every 4 (four) hours as needed for wheezing or shortness of breath. 09/06/23   Kashton Mcartor, Denny Peon, MD  amLODipine-benazepril (LOTREL) 10-20 MG capsule Take 1 capsule by mouth daily.  02/11/16   [provider]  atorvastatin (LIPITOR) 20 MG tablet  Take 1 tablet by mouth daily. 01/19/23 04/19/23  [provider]  benzonatate (TESSALON) 200 MG capsule Take 1 capsule (200 mg total) by mouth 3 (three) times daily as needed for cough. 09/06/23   Devinn Hurwitz, Denny Peon, MD  celecoxib (CELEBREX) 100 MG capsule Take 100 mg by mouth 2 (two) times daily. 11/21/22   [provider]  diclofenac Sodium (VOLTAREN) 1 % GEL Apply 2 g topically 4 (four) times daily.    [provider]  ergocalciferol (VITAMIN D2) 1.25 MG (50000 UT) capsule Take 50,000 Units by mouth once a week.    [provider]  estradiol (ESTRACE) 1 MG tablet Take 1 tablet by mouth daily. 01/12/23   [provider]  Fremanezumab-vfrm 225 MG/1.5ML SOAJ Inject 225 mg into the skin every 28 (twenty-eight) days. 01/19/23 01/19/24  [provider]  hydrochlorothiazide (HYDRODIURIL) 25 MG tablet Take 1 tablet by mouth daily. 11/25/22 11/25/23  [provider]  hydrOXYzine (ATARAX) 25 MG tablet Take 25 mg by mouth 3 (three) times daily.    [provider]  linaclotide Karlene Einstein) 290 MCG CAPS capsule Take 290 mcg by mouth daily before breakfast. 05/27/22   [provider]  naproxen (NAPROSYN)  500 MG tablet Take 500 mg by mouth as needed. 01/19/23 01/19/24  [provider]  omeprazole (PRILOSEC) 40 MG capsule Take 40 mg by mouth 2 (two) times daily.    [provider]  Oxcarbazepine (TRILEPTAL) 300 MG tablet Take 300 mg by mouth in the morning, at noon, and at bedtime. 11/21/22   [provider]  oxyCODONE-acetaminophen (PERCOCET/ROXICET) 5-325 MG tablet Take 1 tablet by mouth every 6 (six) hours as needed for severe pain. 07/21/21   Henderly, Britni A, PA-C  propranolol (INDERAL) 10 MG tablet 10 mg 3 (three) times daily. 12/04/22 12/04/23  [provider]  rizatriptan (MAXALT-MLT) 10 MG disintegrating tablet Take 10 mg by mouth as needed. 01/19/23   [provider]  sertraline (ZOLOFT) 100 MG tablet  Take 100 mg by mouth as directed. Take 200mg  by mouth daily. 12/04/22   [provider]  tiZANidine (ZANAFLEX) 2 MG tablet Take 2 mg by mouth 2 (two) times daily as needed for muscle spasms.    [provider]  topiramate (TOPAMAX) 25 MG capsule Take 25 mg by mouth 2 (two) times daily.    [provider]    Family History Family History  Problem Relation Age of Onset   Hypertension Father    Hypertension Mother     Social History Social History   Tobacco Use   Smoking status: Never   Smokeless tobacco: Never  Vaping Use   Vaping status: Never Used  Substance Use Topics   Alcohol use: Yes    Comment: SOCIAL   Drug use: No     Allergies   Patient has no known allergies.   Review of Systems Review of Systems  Constitutional:  Positive for fatigue.  HENT:  Positive for congestion and rhinorrhea.   Respiratory:  Positive for cough and shortness of breath.   Cardiovascular: Negative.   Gastrointestinal: Negative.   Genitourinary: Negative.   Musculoskeletal: Negative.   Psychiatric/Behavioral: Negative.       Physical Exam Triage Vital Signs ED Triage Vitals [09/17/23 1511]  Encounter Vitals Group     BP      Systolic BP Percentile      Diastolic BP Percentile      Pulse      Resp      Temp      Temp src      SpO2      Weight 158 lb (71.7 kg)     Height 5\' 1"  (1.549 m)     Head Circumference      Peak Flow      Pain Score 8     Pain Loc      Pain Education      Exclude from Growth Chart    No data found.  Updated Vital Signs Ht 5\' 1"  (1.549 m)   Wt 71.7 kg   LMP 09/18/2014   BMI 29.85 kg/m   Visual Acuity Right Eye Distance:   Left Eye Distance:   Bilateral Distance:    Right Eye Near:   Left Eye Near:    Bilateral Near:     Physical Exam Constitutional:      General: She is not in acute distress.    Appearance: Normal appearance. She is normal weight. She is not ill-appearing.  HENT:     Nose: Nose normal.      Mouth/Throat:     Mouth: Mucous membranes are moist.  Cardiovascular:     Rate and Rhythm: Normal rate and  regular rhythm.  Pulmonary:     Effort: Pulmonary effort is normal.     Breath sounds: Normal breath sounds.  Musculoskeletal:     Cervical back: Normal range of motion and neck supple.     Comments: TTP to the left chest/rib area  Skin:    General: Skin is warm.  Neurological:     General: No focal deficit present.     Mental Status: She is alert.  Psychiatric:        Mood and Affect: Mood normal.      UC Treatments / Results  Labs (all labs ordered are listed, but only abnormal results are displayed) Labs Reviewed - No data to display  EKG   Radiology No results found.  Procedures Procedures (including critical care time)  Medications Ordered in UC Medications  ipratropium-albuterol (DUONEB) 0.5-2.5 (3) MG/3ML nebulizer solution 3 mL (has no administration in time range)    Patient appears improved after duoneb given Initial Impression / Assessment and Plan / UC Course  I have reviewed the triage vital signs and the nursing notes.  Pertinent labs & imaging results that were available during my care of the patient were reviewed by me and considered in my medical decision making (see chart for details).  Patient was seen today for continued cough and sob despite earlier treatment.  No new fevers, and last chest xray was normal.  Given her continues symptoms, I will treat again with a steroid and tessalon.  I will also cover for possible bacterial cause with doxy x 10 days.  Advised to return if not improving or worsening.   Final Clinical Impressions(s) / UC Diagnoses   Final diagnoses:  Acute bronchitis, unspecified organism     Discharge Instructions      You were seen today for continued cough.  I have sent out an antibiotic today, along with another round of steroids and cough medication.  If not improving then please return or go to the ER for  further evaluation.     ED Prescriptions     Medication Sig Dispense Auth. Provider   doxycycline (VIBRAMYCIN) 100 MG capsule Take 1 capsule (100 mg total) by mouth 2 (two) times daily. 20 capsule Imran Nuon, MD   predniSONE (DELTASONE) 20 MG tablet Take 2 tablets (40 mg total) by mouth daily for 5 days. 10 tablet Channing Yeager, MD   benzonatate (TESSALON) 200 MG capsule Take 1 capsule (200 mg total) by mouth 3 (three) times daily as needed for cough. 21 capsule Jannifer Franklin, MD      PDMP not reviewed this encounter.   Jannifer Franklin, MD 09/17/23 [phone]

## 2024-01-24 ENCOUNTER — Other Ambulatory Visit: Payer: Self-pay | Admitting: Internal Medicine

## 2024-01-24 DIAGNOSIS — Z1231 Encounter for screening mammogram for malignant neoplasm of breast: Secondary | ICD-10-CM

## 2024-02-11 ENCOUNTER — Other Ambulatory Visit: Payer: Self-pay | Admitting: Internal Medicine

## 2024-02-11 DIAGNOSIS — N644 Mastodynia: Secondary | ICD-10-CM

## 2024-03-06 ENCOUNTER — Ambulatory Visit
Admission: RE | Admit: 2024-03-06 | Discharge: 2024-03-06 | Disposition: A | Discharge status: Home / Self Care | Source: Ambulatory Visit | Attending: Internal Medicine | Admitting: Internal Medicine

## 2024-03-06 ENCOUNTER — Ambulatory Visit

## 2024-03-06 DIAGNOSIS — N644 Mastodynia: Secondary | ICD-10-CM

## 2024-05-26 ENCOUNTER — Emergency Department (HOSPITAL_COMMUNITY)
Admission: EM | Admit: 2024-05-26 | Discharge: 2024-05-27 | Disposition: A | Discharge status: Home / Self Care | Attending: Emergency Medicine | Admitting: Emergency Medicine

## 2024-05-26 DIAGNOSIS — R051 Acute cough: Secondary | ICD-10-CM | POA: Diagnosis not present

## 2024-05-26 DIAGNOSIS — R0981 Nasal congestion: Secondary | ICD-10-CM | POA: Diagnosis not present

## 2024-05-26 DIAGNOSIS — R42 Dizziness and giddiness: Secondary | ICD-10-CM | POA: Diagnosis not present

## 2024-05-26 DIAGNOSIS — R058 Other specified cough: Secondary | ICD-10-CM | POA: Diagnosis present

## 2024-05-27 ENCOUNTER — Other Ambulatory Visit: Payer: Self-pay

## 2024-05-27 ENCOUNTER — Encounter (HOSPITAL_COMMUNITY): Payer: Self-pay | Admitting: *Deleted

## 2024-05-27 ENCOUNTER — Emergency Department (HOSPITAL_COMMUNITY)

## 2024-05-27 LAB — URINALYSIS, ROUTINE W REFLEX MICROSCOPIC
Bilirubin Urine: NEGATIVE
Glucose, UA: NEGATIVE mg/dL
Hgb urine dipstick: NEGATIVE
Ketones, ur: NEGATIVE mg/dL
Leukocytes,Ua: NEGATIVE
Nitrite: NEGATIVE
Protein, ur: NEGATIVE mg/dL
Specific Gravity, Urine: 1.018 (ref 1.005–1.030)
pH: 6 (ref 5.0–8.0)

## 2024-05-27 LAB — RESP PANEL BY RT-PCR (RSV, FLU A&B, COVID)  RVPGX2
Influenza A by PCR: NEGATIVE
Influenza B by PCR: NEGATIVE
Resp Syncytial Virus by PCR: NEGATIVE
SARS Coronavirus 2 by RT PCR: NEGATIVE

## 2024-05-27 LAB — COMPREHENSIVE METABOLIC PANEL WITH GFR
ALT: 18 U/L (ref 0–44)
AST: 28 U/L (ref 15–41)
Albumin: 4.1 g/dL (ref 3.5–5.0)
Alkaline Phosphatase: 82 U/L (ref 38–126)
Anion gap: 11 (ref 5–15)
BUN: 7 mg/dL (ref 6–20)
CO2: 23 mmol/L (ref 22–32)
Calcium: 9.9 mg/dL (ref 8.9–10.3)
Chloride: 107 mmol/L (ref 98–111)
Creatinine, Ser: 0.84 mg/dL (ref 0.44–1.00)
GFR, Estimated: >60 mL/min (ref >60)
Glucose, Bld: 97 mg/dL (ref 70–99)
Potassium: 3.3 mmol/L — ABNORMAL LOW (ref 3.5–5.1)
Sodium: 141 mmol/L (ref 135–145)
Total Bilirubin: 0.3 mg/dL (ref 0.0–1.2)
Total Protein: 7.3 g/dL (ref 6.5–8.1)

## 2024-05-27 LAB — CBC
HCT: 39.2 % (ref 36.0–46.0)
Hemoglobin: 13 g/dL (ref 12.0–15.0)
MCH: 30.2 pg (ref 26.0–34.0)
MCHC: 33.2 g/dL (ref 30.0–36.0)
MCV: 91 fL (ref 80.0–100.0)
Platelets: 259 K/uL (ref 150–400)
RBC: 4.31 MIL/uL (ref 3.87–5.11)
RDW: 13.2 % (ref 11.5–15.5)
WBC: 4.6 K/uL (ref 4.0–10.5)
nRBC: 0 % (ref 0.0–0.2)

## 2024-05-27 LAB — LIPASE, BLOOD: Lipase: 35 U/L (ref 11–51)

## 2024-05-27 MED ORDER — AMOXICILLIN-POT CLAVULANATE 875-125 MG PO TABS
1.0000 | ORAL_TABLET | Freq: Once | ORAL | Status: AC
  Administered 2024-05-27: 1 via ORAL
  Filled 2024-05-27: qty 1

## 2024-05-27 MED ORDER — IPRATROPIUM-ALBUTEROL 0.5-2.5 (3) MG/3ML IN SOLN
3.0000 mL | Freq: Once | RESPIRATORY_TRACT | Status: AC
  Administered 2024-05-27: 3 mL via RESPIRATORY_TRACT
  Filled 2024-05-27: qty 3

## 2024-05-27 MED ORDER — BENZONATATE 200 MG PO CAPS: 200.0000 mg | ORAL_CAPSULE | Freq: Three times a day (TID) | ORAL | 0 refills | Status: AC | PRN

## 2024-05-27 MED ORDER — AMOXICILLIN-POT CLAVULANATE 875-125 MG PO TABS: 1.0000 | ORAL_TABLET | Freq: Two times a day (BID) | ORAL | 0 refills | Status: AC

## 2024-05-27 MED ORDER — BENZONATATE 100 MG PO CAPS
100.0000 mg | ORAL_CAPSULE | Freq: Once | ORAL | Status: AC
Start: 2024-05-27 — End: 2024-05-27
  Administered 2024-05-27: 100 mg via ORAL
  Filled 2024-05-27: qty 1

## 2024-05-27 NOTE — Discharge Instructions (Addendum)
 Your labs and imaging looked good.  Will treat you with antibiotic given your length of illness.  Please take medications as directed.  Please return for new or worsening symptoms.

## 2024-05-27 NOTE — ED Triage Notes (Signed)
 The pt reports that she has not been feeling well for the past 9 days she has a cough and she feels off balanced with some chest pain

## 2024-05-27 NOTE — ED Provider Notes (Signed)
 [facility] Emergency Department Provider Note MRN:  [id number]  Arrival date & time: 05/27/24     Chief Complaint   not feeling well   History of Present Illness   Natalie Hunt is a 53 y.o. year-old female presents to the ED with chief complaint of cough and congestion for the past week.  Has tried taking OTC cough and could meds without relief.  States that she felt worse today and like she was lightheaded and off balance.  Denies fever or chills.  States that the cough has become productive over the past few days.  States that she has some rattling in the chest.     Review of Systems  Pertinent positive and negative review of systems noted in HPI.    Physical Exam   Vitals:   05/27/24 0308 05/27/24 0459  BP: (!) 148/101 (!) 133/95  Pulse: 86 68  Resp: 18 17  Temp: 98.5 F (36.9 C) 97.6 F (36.4 C)  SpO2: 100% 100%    CONSTITUTIONAL:  non toxic-appearing, NAD NEURO:  Alert and oriented x 3, CN 3-12 grossly intact EYES:  eyes equal and reactive ENT/NECK:  Supple, no stridor  CARDIO:  normal rate, regular rhythm, appears well-perfused  PULM:  No respiratory distress, coarse cough and lung sounds GI/GU:  non-distended,  MSK/SPINE:  No gross deformities, no edema, moves all extremities  SKIN:  no rash, atraumatic   *Additional and/or pertinent findings included in MDM below  Diagnostic and Interventional Summary    EKG Interpretation Date/Time:    Ventricular Rate:    PR Interval:    QRS Duration:    QT Interval:    QTC Calculation:   R Axis:      Text Interpretation:         Labs Reviewed  COMPREHENSIVE METABOLIC PANEL WITH GFR - Abnormal; Notable for the following components:      Result Value   Potassium 3.3 (*)    All other components within normal limits  URINALYSIS, ROUTINE W REFLEX MICROSCOPIC - Abnormal; Notable for the following components:   APPearance HAZY (*)    Bacteria, UA RARE (*)    All other components  within normal limits  RESP PANEL BY RT-PCR (RSV, FLU A&B, COVID)  [id number]  LIPASE, BLOOD  CBC    DG Chest 2 View  Final Result      Medications  amoxicillin -clavulanate (AUGMENTIN ) 875-125 MG per tablet 1 tablet (has no administration in time range)  benzonatate  (TESSALON ) capsule 100 mg (has no administration in time range)  ipratropium-albuterol  (DUONEB) 0.5-2.5 (3) MG/3ML nebulizer solution 3 mL (3 mLs Nebulization Given 05/27/24 0522)     Procedures  /  Critical Care Procedures  ED Course and Medical Decision Making  I have reviewed the triage vital signs, the nursing notes, and pertinent available records from the EMR.  Social Determinants Affecting Complexity of Care: Patient has no clinically significant social determinants affecting this chief complaint..   ED Course:    Medical Decision Making Patient here with worsening cough and congestion for the past week.  Denies measured fever at home.  She states that her cough is becoming more productive and more coarse.  She states that she has had some rattling in her chest for the past day or 2.  States that she feels lightheaded due to the cough.  Her vitals are stable and reassuring.  Doubt ACS, doubt PE.  No obvious pneumonia seen on chest x-ray, but given her worsening  symptoms over the past couple of days, will trial antibiotic treatment.  Will give breathing treatment here.  Will reassess.  Risk Prescription drug management.         Consultants: No consultations were needed in caring for this patient.   Treatment and Plan: I considered admission due to patient's initial presentation, but after considering the examination and diagnostic results, patient will not require admission and can be discharged with outpatient follow-up.    Final Clinical Impressions(s) / ED Diagnoses     ICD-10-CM   1. Acute cough  R05.1     2. Sinus congestion  R09.81       ED Discharge Orders          Ordered     amoxicillin -clavulanate (AUGMENTIN ) 875-125 MG tablet  Every 12 hours        05/27/24 0550    benzonatate  (TESSALON ) 200 MG capsule  3 times daily PRN        05/27/24 0550              Discharge Instructions Discussed with and Provided to Patient:     Discharge Instructions      Your labs and imaging looked good.  Will treat you with antibiotic given your length of illness.  Please take medications as directed.  Please return for new or worsening symptoms.       Vicky Charleston, PA-C 05/27/24 9448    Carita Senior, MD 05/27/24 [phone]
